# Patient Record
Sex: Male | Born: 1995
Health system: Southern US, Community
[De-identification: ages and names within clinical notes are randomized; demographics above are authoritative.]

## PROBLEM LIST (undated history)

## (undated) DIAGNOSIS — T7840XA Allergy, unspecified, initial encounter: Secondary | ICD-10-CM

## (undated) DIAGNOSIS — S31139A Puncture wound of abdominal wall without foreign body, unspecified quadrant without penetration into peritoneal cavity, initial encounter: Secondary | ICD-10-CM

## (undated) DIAGNOSIS — J45909 Unspecified asthma, uncomplicated: Secondary | ICD-10-CM

## (undated) DIAGNOSIS — W3400XA Accidental discharge from unspecified firearms or gun, initial encounter: Secondary | ICD-10-CM

## (undated) HISTORY — PX: ABDOMINAL SURGERY: SHX537

## (undated) HISTORY — PX: OTHER SURGICAL HISTORY: SHX169

## (undated) HISTORY — DX: Allergy, unspecified, initial encounter: T78.40XA

---
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
## 2014-09-13 ENCOUNTER — Inpatient Hospital Stay (HOSPITAL_COMMUNITY): Payer: 59

## 2014-09-13 ENCOUNTER — Inpatient Hospital Stay (HOSPITAL_COMMUNITY)
Admission: EM | Admit: 2014-09-13 | Discharge: 2014-09-19 | DRG: 957 | Disposition: A | Discharge status: Home / Self Care | Payer: 59 | Attending: General Surgery | Admitting: General Surgery

## 2014-09-13 ENCOUNTER — Encounter (HOSPITAL_COMMUNITY): Payer: Self-pay | Admitting: Certified Registered"

## 2014-09-13 ENCOUNTER — Emergency Department (HOSPITAL_COMMUNITY): Payer: 59

## 2014-09-13 ENCOUNTER — Emergency Department (HOSPITAL_COMMUNITY): Payer: 59 | Admitting: Certified Registered"

## 2014-09-13 ENCOUNTER — Encounter (HOSPITAL_COMMUNITY): Admission: EM | Disposition: A | Discharge status: Home / Self Care | Payer: Self-pay | Source: Home / Self Care

## 2014-09-13 DIAGNOSIS — R579 Shock, unspecified: Secondary | ICD-10-CM | POA: Diagnosis present

## 2014-09-13 DIAGNOSIS — N170 Acute kidney failure with tubular necrosis: Secondary | ICD-10-CM | POA: Diagnosis not present

## 2014-09-13 DIAGNOSIS — K661 Hemoperitoneum: Secondary | ICD-10-CM | POA: Diagnosis present

## 2014-09-13 DIAGNOSIS — S37031A Laceration of right kidney, unspecified degree, initial encounter: Principal | ICD-10-CM | POA: Diagnosis present

## 2014-09-13 DIAGNOSIS — S36119A Unspecified injury of liver, initial encounter: Secondary | ICD-10-CM | POA: Diagnosis present

## 2014-09-13 DIAGNOSIS — R578 Other shock: Secondary | ICD-10-CM | POA: Diagnosis present

## 2014-09-13 DIAGNOSIS — S36116A Major laceration of liver, initial encounter: Secondary | ICD-10-CM | POA: Diagnosis present

## 2014-09-13 DIAGNOSIS — S37001A Unspecified injury of right kidney, initial encounter: Secondary | ICD-10-CM | POA: Diagnosis present

## 2014-09-13 DIAGNOSIS — S31640A Puncture wound with foreign body of abdominal wall, right upper quadrant with penetration into peritoneal cavity, initial encounter: Secondary | ICD-10-CM | POA: Diagnosis present

## 2014-09-13 DIAGNOSIS — D62 Acute posthemorrhagic anemia: Secondary | ICD-10-CM | POA: Diagnosis present

## 2014-09-13 DIAGNOSIS — W3400XA Accidental discharge from unspecified firearms or gun, initial encounter: Secondary | ICD-10-CM | POA: Diagnosis not present

## 2014-09-13 DIAGNOSIS — Z978 Presence of other specified devices: Secondary | ICD-10-CM

## 2014-09-13 HISTORY — DX: Unspecified asthma, uncomplicated: J45.909

## 2014-09-13 HISTORY — PX: LAPAROTOMY: SHX154

## 2014-09-13 HISTORY — PX: NEPHRECTOMY: SHX65

## 2014-09-13 LAB — APTT: aPTT: 37 seconds (ref 24–37)

## 2014-09-13 LAB — POCT I-STAT 3, ART BLOOD GAS (G3+)
ACID-BASE DEFICIT: 1 mmol/L (ref 0.0–2.0)
BICARBONATE: 23.7 meq/L (ref 20.0–24.0)
O2 SAT: 100 %
PH ART: 7.382 (ref 7.350–7.450)
TCO2: 25 mmol/L (ref 0–100)
pCO2 arterial: 39.9 mmHg (ref 35.0–45.0)
pO2, Arterial: 244 mmHg — ABNORMAL HIGH (ref 80.0–100.0)

## 2014-09-13 LAB — BASIC METABOLIC PANEL
Anion gap: 7 (ref 5–15)
BUN: 10 mg/dL (ref 6–23)
CALCIUM: 8.2 mg/dL — AB (ref 8.4–10.5)
CO2: 22 mmol/L (ref 19–32)
Chloride: 105 mmol/L (ref 96–112)
Creatinine, Ser: 1.45 mg/dL — ABNORMAL HIGH (ref 0.50–1.35)
GFR calc Af Amer: 79 mL/min — ABNORMAL LOW (ref >90)
GFR, EST NON AFRICAN AMERICAN: 68 mL/min — AB (ref >90)
GLUCOSE: 183 mg/dL — AB (ref 70–99)
Potassium: 3.3 mmol/L — ABNORMAL LOW (ref 3.5–5.1)
Sodium: 134 mmol/L — ABNORMAL LOW (ref 135–145)

## 2014-09-13 LAB — COMPREHENSIVE METABOLIC PANEL
ALT: 61 U/L — ABNORMAL HIGH (ref 0–53)
AST: 97 U/L — ABNORMAL HIGH (ref 0–37)
Albumin: 4.3 g/dL (ref 3.5–5.2)
Alkaline Phosphatase: 80 U/L (ref 39–117)
Anion gap: 25 — ABNORMAL HIGH (ref 5–15)
BUN: 10 mg/dL (ref 6–23)
CO2: 10 mmol/L — CL (ref 19–32)
Calcium: 9.4 mg/dL (ref 8.4–10.5)
Chloride: 103 mmol/L (ref 96–112)
Creatinine, Ser: 1.55 mg/dL — ABNORMAL HIGH (ref 0.50–1.35)
GFR calc Af Amer: 73 mL/min — ABNORMAL LOW (ref >90)
GFR calc non Af Amer: 63 mL/min — ABNORMAL LOW (ref >90)
Glucose, Bld: 135 mg/dL — ABNORMAL HIGH (ref 70–99)
Potassium: 3.1 mmol/L — ABNORMAL LOW (ref 3.5–5.1)
Sodium: 138 mmol/L (ref 135–145)
Total Bilirubin: 1.3 mg/dL — ABNORMAL HIGH (ref 0.3–1.2)
Total Protein: 6.7 g/dL (ref 6.0–8.3)

## 2014-09-13 LAB — CBC
HCT: 45.2 % (ref 39.0–52.0)
HEMATOCRIT: 31.6 % — AB (ref 39.0–52.0)
HEMATOCRIT: 35.6 % — AB (ref 39.0–52.0)
HEMOGLOBIN: 14.9 g/dL (ref 13.0–17.0)
Hemoglobin: 10.7 g/dL — ABNORMAL LOW (ref 13.0–17.0)
Hemoglobin: 12.1 g/dL — ABNORMAL LOW (ref 13.0–17.0)
MCH: 29.8 pg (ref 26.0–34.0)
MCH: 30 pg (ref 26.0–34.0)
MCH: 31.4 pg (ref 26.0–34.0)
MCHC: 33 g/dL (ref 30.0–36.0)
MCHC: 33.9 g/dL (ref 30.0–36.0)
MCHC: 34 g/dL (ref 30.0–36.0)
MCV: 87.7 fL (ref 78.0–100.0)
MCV: 88.5 fL (ref 78.0–100.0)
MCV: 95.2 fL (ref 78.0–100.0)
PLATELETS: 154 10*3/uL (ref 150–400)
Platelets: 182 10*3/uL (ref 150–400)
Platelets: 226 10*3/uL (ref 150–400)
RBC: 3.57 MIL/uL — AB (ref 4.22–5.81)
RBC: 4.06 MIL/uL — ABNORMAL LOW (ref 4.22–5.81)
RBC: 4.75 MIL/uL (ref 4.22–5.81)
RDW: 13 % (ref 11.5–15.5)
RDW: 13 % (ref 11.5–15.5)
RDW: 13.3 % (ref 11.5–15.5)
WBC: 11.5 10*3/uL — ABNORMAL HIGH (ref 4.0–10.5)
WBC: 13.8 10*3/uL — ABNORMAL HIGH (ref 4.0–10.5)
WBC: 9.4 10*3/uL (ref 4.0–10.5)

## 2014-09-13 LAB — POCT I-STAT 7, (LYTES, BLD GAS, ICA,H+H)
Acid-base deficit: 3 mmol/L — ABNORMAL HIGH (ref 0.0–2.0)
Acid-base deficit: 3 mmol/L — ABNORMAL HIGH (ref 0.0–2.0)
Bicarbonate: 23.1 meq/L (ref 20.0–24.0)
Bicarbonate: 23.4 meq/L (ref 20.0–24.0)
CALCIUM ION: 0.99 mmol/L — AB (ref 1.12–1.23)
Calcium, Ion: 0.92 mmol/L — ABNORMAL LOW (ref 1.12–1.23)
HCT: 38 % — ABNORMAL LOW (ref 39.0–52.0)
HCT: 29 % — ABNORMAL LOW (ref 39.0–52.0)
HEMOGLOBIN: 12.9 g/dL — AB (ref 13.0–17.0)
Hemoglobin: 9.9 g/dL — ABNORMAL LOW (ref 13.0–17.0)
O2 Saturation: 100 %
O2 Saturation: 100 %
PCO2 ART: 46.7 mmHg — AB (ref 35.0–45.0)
PCO2 ART: 45.9 mmHg — AB (ref 35.0–45.0)
PO2 ART: 309 mmHg — AB (ref 80.0–100.0)
POTASSIUM: 3.6 mmol/L (ref 3.5–5.1)
POTASSIUM: 3.5 mmol/L (ref 3.5–5.1)
Sodium: 140 mmol/L (ref 135–145)
Sodium: 139 mmol/L (ref 135–145)
TCO2: 24 mmol/L (ref 0–100)
TCO2: 25 mmol/L (ref 0–100)
pH, Arterial: 7.303 — ABNORMAL LOW (ref 7.350–7.450)
pH, Arterial: 7.315 — ABNORMAL LOW (ref 7.350–7.450)
pO2, Arterial: 335 mmHg — ABNORMAL HIGH (ref 80.0–100.0)

## 2014-09-13 LAB — CDS SEROLOGY

## 2014-09-13 LAB — I-STAT CHEM 8, ED
BUN: 13 mg/dL (ref 6–23)
CREATININE: 1.4 mg/dL — AB (ref 0.50–1.35)
Calcium, Ion: 1.18 mmol/L (ref 1.12–1.23)
Chloride: 104 mmol/L (ref 96–112)
GLUCOSE: 131 mg/dL — AB (ref 70–99)
HCT: 49 % (ref 39.0–52.0)
Hemoglobin: 16.7 g/dL (ref 13.0–17.0)
Potassium: 3.4 mmol/L — ABNORMAL LOW (ref 3.5–5.1)
SODIUM: 141 mmol/L (ref 135–145)
TCO2: 10 mmol/L (ref 0–100)

## 2014-09-13 LAB — MRSA PCR SCREENING: MRSA by PCR: NEGATIVE

## 2014-09-13 LAB — PROTIME-INR
INR: 1.22 (ref 0.00–1.49)
INR: 1.43 (ref 0.00–1.49)
PROTHROMBIN TIME: 15.6 s — AB (ref 11.6–15.2)
Prothrombin Time: 17.6 seconds — ABNORMAL HIGH (ref 11.6–15.2)

## 2014-09-13 LAB — ETHANOL: Alcohol, Ethyl (B): <5 mg/dL (ref 0–9)

## 2014-09-13 LAB — BLOOD PRODUCT ORDER (VERBAL) VERIFICATION

## 2014-09-13 LAB — I-STAT CG4 LACTIC ACID, ED: Lactic Acid, Venous: >17 mmol/L (ref 0.5–2.0)

## 2014-09-13 LAB — LACTIC ACID, PLASMA: Lactic Acid, Venous: 1.5 mmol/L (ref 0.5–2.0)

## 2014-09-13 LAB — ABO/RH: ABO/RH(D): O POS

## 2014-09-13 LAB — TRIGLYCERIDES: Triglycerides: 127 mg/dL (ref <150)

## 2014-09-13 SURGERY — LAPAROTOMY, EXPLORATORY
Anesthesia: General | Site: Abdomen | Laterality: Right

## 2014-09-13 MED ORDER — LIDOCAINE HCL (CARDIAC) 20 MG/ML IV SOLN
INTRAVENOUS | Status: DC | PRN
  Administered 2014-09-13: 100 mg via INTRAVENOUS

## 2014-09-13 MED ORDER — PANTOPRAZOLE SODIUM 40 MG IV SOLR
40.0000 mg | Freq: Every day | INTRAVENOUS | Status: DC
  Administered 2014-09-13 – 2014-09-17 (×4): 40 mg via INTRAVENOUS
  Filled 2014-09-13 (×4): qty 40

## 2014-09-13 MED ORDER — SUCCINYLCHOLINE CHLORIDE 20 MG/ML IJ SOLN
INTRAMUSCULAR | Status: DC | PRN
  Administered 2014-09-13: 140 mg via INTRAVENOUS

## 2014-09-13 MED ORDER — CEFAZOLIN SODIUM-DEXTROSE 2-3 GM-% IV SOLR
2.0000 g | Freq: Once | INTRAVENOUS | Status: AC
  Administered 2014-09-13: 2 g via INTRAVENOUS

## 2014-09-13 MED ORDER — MIDAZOLAM HCL 2 MG/2ML IJ SOLN
INTRAMUSCULAR | Status: AC
  Filled 2014-09-13: qty 2

## 2014-09-13 MED ORDER — FENTANYL BOLUS VIA INFUSION
50.0000 ug | INTRAVENOUS | Status: DC | PRN
  Administered 2014-09-13 (×3): 50 ug via INTRAVENOUS
  Filled 2014-09-13: qty 50

## 2014-09-13 MED ORDER — IBUPROFEN 200 MG PO TABS: 200.0000 mg | ORAL_TABLET | Freq: Four times a day (QID) | ORAL | Status: DC | PRN

## 2014-09-13 MED ORDER — CHLORHEXIDINE GLUCONATE 0.12 % MT SOLN
15.0000 mL | Freq: Two times a day (BID) | OROMUCOSAL | Status: DC
  Administered 2014-09-13: 15 mL via OROMUCOSAL
  Filled 2014-09-13: qty 15

## 2014-09-13 MED ORDER — PANTOPRAZOLE SODIUM 40 MG PO TBEC: 40.0000 mg | DELAYED_RELEASE_TABLET | Freq: Every day | ORAL | Status: DC

## 2014-09-13 MED ORDER — PROPOFOL 10 MG/ML IV BOLUS
INTRAVENOUS | Status: DC | PRN
  Administered 2014-09-13: 150 mg via INTRAVENOUS

## 2014-09-13 MED ORDER — PROPOFOL 1000 MG/100ML IV EMUL
0.0000 ug/kg/min | INTRAVENOUS | Status: DC
  Administered 2014-09-13 (×2): 50 ug/kg/min via INTRAVENOUS
  Filled 2014-09-13: qty 100

## 2014-09-13 MED ORDER — FENTANYL CITRATE (PF) 100 MCG/2ML IJ SOLN
50.0000 ug | Freq: Once | INTRAMUSCULAR | Status: AC
  Administered 2014-09-13: 50 ug via INTRAVENOUS
  Filled 2014-09-13: qty 2

## 2014-09-13 MED ORDER — HEMOSTATIC AGENTS (NO CHARGE) OPTIME
TOPICAL | Status: DC | PRN
  Administered 2014-09-13: 2 via TOPICAL

## 2014-09-13 MED ORDER — FENTANYL CITRATE (PF) 100 MCG/2ML IJ SOLN
INTRAMUSCULAR | Status: DC | PRN
  Administered 2014-09-13: 50 ug via INTRAVENOUS
  Administered 2014-09-13 (×2): 100 ug via INTRAVENOUS

## 2014-09-13 MED ORDER — KETOROLAC TROMETHAMINE 30 MG/ML IJ SOLN: 30.0000 mg | Freq: Once | INTRAMUSCULAR | Status: DC | PRN

## 2014-09-13 MED ORDER — OXYCODONE HCL 5 MG PO TABS: 5.0000 mg | ORAL_TABLET | Freq: Once | ORAL | Status: DC | PRN

## 2014-09-13 MED ORDER — MEPERIDINE HCL 25 MG/ML IJ SOLN: 6.2500 mg | INTRAMUSCULAR | Status: DC | PRN

## 2014-09-13 MED ORDER — IBUPROFEN 100 MG/5ML PO SUSP
200.0000 mg | Freq: Four times a day (QID) | ORAL | Status: DC | PRN
  Filled 2014-09-13: qty 20

## 2014-09-13 MED ORDER — OXYCODONE HCL 5 MG/5ML PO SOLN: 5.0000 mg | Freq: Once | ORAL | Status: DC | PRN

## 2014-09-13 MED ORDER — SODIUM CHLORIDE 0.9 % IV SOLN
Freq: Once | INTRAVENOUS | Status: AC
  Administered 2014-09-13: 999 mL via INTRAVENOUS

## 2014-09-13 MED ORDER — MIDAZOLAM HCL 5 MG/5ML IJ SOLN
INTRAMUSCULAR | Status: DC | PRN
  Administered 2014-09-13 (×2): 2 mg via INTRAVENOUS

## 2014-09-13 MED ORDER — FENTANYL CITRATE (PF) 100 MCG/2ML IJ SOLN
50.0000 ug | Freq: Once | INTRAMUSCULAR | Status: AC
  Administered 2014-09-13: 50 ug via INTRAVENOUS

## 2014-09-13 MED ORDER — ROCURONIUM BROMIDE 100 MG/10ML IV SOLN
INTRAVENOUS | Status: DC | PRN
  Administered 2014-09-13 (×2): 50 mg via INTRAVENOUS

## 2014-09-13 MED ORDER — FENTANYL CITRATE (PF) 2500 MCG/50ML IJ SOLN
25.0000 ug/h | INTRAMUSCULAR | Status: DC
  Administered 2014-09-13: 100 ug/h via INTRAVENOUS
  Filled 2014-09-13: qty 50

## 2014-09-13 MED ORDER — CEFAZOLIN SODIUM-DEXTROSE 2-3 GM-% IV SOLR
INTRAVENOUS | Status: AC
  Filled 2014-09-13: qty 50

## 2014-09-13 MED ORDER — FENTANYL CITRATE (PF) 250 MCG/5ML IJ SOLN
INTRAMUSCULAR | Status: AC
  Filled 2014-09-13: qty 5

## 2014-09-13 MED ORDER — HYDROMORPHONE HCL 1 MG/ML IJ SOLN: 0.2500 mg | INTRAMUSCULAR | Status: DC | PRN

## 2014-09-13 MED ORDER — KCL IN DEXTROSE-NACL 20-5-0.45 MEQ/L-%-% IV SOLN
INTRAVENOUS | Status: DC
  Administered 2014-09-13 – 2014-09-14 (×4): via INTRAVENOUS
  Filled 2014-09-13 (×7): qty 1000

## 2014-09-13 MED ORDER — PROPOFOL INFUSION 10 MG/ML OPTIME
INTRAVENOUS | Status: DC | PRN
  Administered 2014-09-13: 100 ug/kg/min via INTRAVENOUS

## 2014-09-13 MED ORDER — FENTANYL CITRATE (PF) 100 MCG/2ML IJ SOLN
INTRAMUSCULAR | Status: AC
  Administered 2014-09-13: 50 ug via INTRAVENOUS
  Filled 2014-09-13: qty 2

## 2014-09-13 MED ORDER — ONDANSETRON HCL 4 MG/2ML IJ SOLN
4.0000 mg | Freq: Four times a day (QID) | INTRAMUSCULAR | Status: DC | PRN
  Administered 2014-09-13: 4 mg via INTRAVENOUS
  Filled 2014-09-13: qty 2

## 2014-09-13 MED ORDER — HYDROMORPHONE HCL 1 MG/ML IJ SOLN
0.5000 mg | INTRAMUSCULAR | Status: DC | PRN
  Administered 2014-09-13 (×5): 1 mg via INTRAVENOUS
  Filled 2014-09-13 (×5): qty 1

## 2014-09-13 MED ORDER — CETYLPYRIDINIUM CHLORIDE 0.05 % MT LIQD
7.0000 mL | Freq: Four times a day (QID) | OROMUCOSAL | Status: DC
  Administered 2014-09-13: 7 mL via OROMUCOSAL

## 2014-09-13 MED ORDER — 0.9 % SODIUM CHLORIDE (POUR BTL) OPTIME
TOPICAL | Status: DC | PRN
  Administered 2014-09-13: 4000 mL

## 2014-09-13 MED ORDER — HYDROMORPHONE HCL 1 MG/ML IJ SOLN
1.0000 mg | INTRAMUSCULAR | Status: DC | PRN
  Administered 2014-09-14 – 2014-09-15 (×11): 2 mg via INTRAVENOUS
  Filled 2014-09-13 (×11): qty 2

## 2014-09-13 MED ORDER — LACTATED RINGERS IV SOLN
INTRAVENOUS | Status: DC | PRN
  Administered 2014-09-13 (×3): via INTRAVENOUS

## 2014-09-13 MED ORDER — PROPOFOL 10 MG/ML IV BOLUS
INTRAVENOUS | Status: AC
  Filled 2014-09-13: qty 20

## 2014-09-13 SURGICAL SUPPLY — 57 items
APPLIER CLIP ROT 10 11.4 M/L (STAPLE) ×4
BLADE SURG ROTATE 9660 (MISCELLANEOUS) IMPLANT
CANISTER SUCTION 2500CC (MISCELLANEOUS) ×4 IMPLANT
CHLORAPREP W/TINT 26ML (MISCELLANEOUS) IMPLANT
CLIP APPLIE ROT 10 11.4 M/L (STAPLE) ×2 IMPLANT
CLIP TI LARGE 6 (CLIP) ×4 IMPLANT
COVER MAYO STAND STRL (DRAPES) IMPLANT
COVER SURGICAL LIGHT HANDLE (MISCELLANEOUS) ×4 IMPLANT
CUTTER LINEAR ENDO 35 ART THIN (STAPLE) ×4 IMPLANT
DRAPE LAPAROSCOPIC ABDOMINAL (DRAPES) ×4 IMPLANT
DRAPE PROXIMA HALF (DRAPES) IMPLANT
DRAPE UTILITY XL STRL (DRAPES) ×8 IMPLANT
DRAPE WARM FLUID 44X44 (DRAPE) ×4 IMPLANT
DRSG OPSITE POSTOP 4X10 (GAUZE/BANDAGES/DRESSINGS) IMPLANT
DRSG OPSITE POSTOP 4X8 (GAUZE/BANDAGES/DRESSINGS) IMPLANT
ELECT BLADE 6.5 EXT (BLADE) ×4 IMPLANT
ELECT CAUTERY BLADE 6.4 (BLADE) ×8 IMPLANT
ELECT REM PT RETURN 9FT ADLT (ELECTROSURGICAL) ×4
ELECTRODE REM PT RTRN 9FT ADLT (ELECTROSURGICAL) ×2 IMPLANT
GLOVE BIO SURGEON STRL SZ7.5 (GLOVE) ×12 IMPLANT
GLOVE BIO SURGEON STRL SZ8 (GLOVE) ×4 IMPLANT
GLOVE BIOGEL PI IND STRL 7.0 (GLOVE) ×2 IMPLANT
GLOVE BIOGEL PI IND STRL 7.5 (GLOVE) ×2 IMPLANT
GLOVE BIOGEL PI IND STRL 8 (GLOVE) ×4 IMPLANT
GLOVE BIOGEL PI INDICATOR 7.0 (GLOVE) ×2
GLOVE BIOGEL PI INDICATOR 7.5 (GLOVE) ×2
GLOVE BIOGEL PI INDICATOR 8 (GLOVE) ×4
GLOVE ECLIPSE 7.0 STRL STRAW (GLOVE) ×8 IMPLANT
GOWN STRL REUS W/ TWL LRG LVL3 (GOWN DISPOSABLE) ×4 IMPLANT
GOWN STRL REUS W/ TWL XL LVL3 (GOWN DISPOSABLE) ×2 IMPLANT
GOWN STRL REUS W/TWL LRG LVL3 (GOWN DISPOSABLE) ×4
GOWN STRL REUS W/TWL XL LVL3 (GOWN DISPOSABLE) ×2
KIT BASIN OR (CUSTOM PROCEDURE TRAY) ×4 IMPLANT
KIT ROOM TURNOVER OR (KITS) ×4 IMPLANT
LIGASURE IMPACT 36 18CM CVD LR (INSTRUMENTS) IMPLANT
NS IRRIG 1000ML POUR BTL (IV SOLUTION) ×8 IMPLANT
PACK GENERAL/GYN (CUSTOM PROCEDURE TRAY) ×4 IMPLANT
PAD ARMBOARD 7.5X6 YLW CONV (MISCELLANEOUS) ×4 IMPLANT
PENCIL BUTTON HOLSTER BLD 10FT (ELECTRODE) IMPLANT
RELOAD /EVU35 (ENDOMECHANICALS) ×4 IMPLANT
SOLUTION BETADINE 4OZ (MISCELLANEOUS) ×4 IMPLANT
SPECIMEN JAR LARGE (MISCELLANEOUS) IMPLANT
SPONGE GAUZE 4X4 12PLY STER LF (GAUZE/BANDAGES/DRESSINGS) ×4 IMPLANT
SPONGE LAP 18X18 X RAY DECT (DISPOSABLE) ×24 IMPLANT
STAPLER VISISTAT 35W (STAPLE) ×4 IMPLANT
SUCTION POOLE TIP (SUCTIONS) ×4 IMPLANT
SUT PDS AB 1 TP1 96 (SUTURE) ×8 IMPLANT
SUT SILK 2 0 SH CR/8 (SUTURE) ×4 IMPLANT
SUT SILK 2 0 TIES 10X30 (SUTURE) ×4 IMPLANT
SUT SILK 3 0 SH CR/8 (SUTURE) ×4 IMPLANT
SUT SILK 3 0 TIES 10X30 (SUTURE) ×4 IMPLANT
TAPE CLOTH SURG 4X10 WHT LF (GAUZE/BANDAGES/DRESSINGS) ×4 IMPLANT
TOWEL OR 17X26 10 PK STRL BLUE (TOWEL DISPOSABLE) ×4 IMPLANT
TRAY FOLEY CATH 16FRSI W/METER (SET/KITS/TRAYS/PACK) ×4 IMPLANT
TUBE CONNECTING 12'X1/4 (SUCTIONS)
TUBE CONNECTING 12X1/4 (SUCTIONS) IMPLANT
YANKAUER SUCT BULB TIP NO VENT (SUCTIONS) IMPLANT

## 2014-09-13 NOTE — ED Notes (Signed)
Patient taken to the OR

## 2014-09-13 NOTE — Anesthesia Preprocedure Evaluation (Addendum)
Anesthesia Evaluation  Patient identified by MRN, date of birth, ID band Patient awake    Reviewed: Allergy & Precautions, NPO status , Patient's Chart, lab work & pertinent test resultsPreop documentation limited or incomplete due to emergent nature of procedure.  Airway Mallampati: I  TM Distance: >3 FB Neck ROM: Full    Dental  (+) Teeth Intact, Dental Advisory Given   Pulmonary  breath sounds clear to auscultation        Cardiovascular Rhythm:Regular Rate:Tachycardia     Neuro/Psych    GI/Hepatic   Endo/Other    Renal/GU      Musculoskeletal   Abdominal   Peds  Hematology   Anesthesia Other Findings   Reproductive/Obstetrics                            Anesthesia Physical Anesthesia Plan  ASA: IV and emergent  Anesthesia Plan: General   Post-op Pain Management:    Induction: Intravenous, Rapid sequence and Cricoid pressure planned  Airway Management Planned: Oral ETT  Additional Equipment:   Intra-op Plan:   Post-operative Plan: Possible Post-op intubation/ventilation  Informed Consent: I have reviewed the patients History and Physical, chart, labs and discussed the procedure including the risks, benefits and alternatives for the proposed anesthesia with the patient or authorized representative who has indicated his/her understanding and acceptance.   Dental advisory given  Plan Discussed with: Anesthesiologist, Surgeon and CRNA  Anesthesia Plan Comments:        Anesthesia Quick Evaluation
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
## 2014-09-13 NOTE — Progress Notes (Signed)
UR completed.  Shayla Heming, RN BSN M[facility] CCM Trauma/Neuro ICU Case Manager [phone]  
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
## 2014-09-13 NOTE — Progress Notes (Signed)
Fentanyl gtt 150 cc wasted in sink. Witnessed by K. Hyatt, Charity fundraiserN.

## 2014-09-13 NOTE — Transfer of Care (Signed)
Immediate Anesthesia Transfer of Care Note  Patient: Zachary Villegas  Procedure(s) Performed: Procedure(s): EXPLORATORY LAPAROTOMY,HEPATORRPHY REPAIR<AND RIGHT KIDNEY REMOVAL. (N/A) NEPHRECTOMY (Right)  Patient Location: ICU  Anesthesia Type:General  Level of Consciousness: sedated, unresponsive and Patient remains intubated per anesthesia plan  Airway & Oxygen Therapy: Patient remains intubated per anesthesia plan and Patient placed on Ventilator (see vital sign flow sheet for setting)  Post-op Assessment: Report given to RN and Post -op Vital signs reviewed and stable  Post vital signs: Reviewed and stable  Last Vitals:  Filed Vitals:   09/13/14 0015  BP: 114/59  Pulse: 107  Resp: 18    Complications: No apparent anesthesia complications
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
## 2014-09-13 NOTE — Anesthesia Postprocedure Evaluation (Signed)
  Anesthesia Post-op Note  Patient: Careers adviseravour XXXChukwu  Procedure(s) Performed: Procedure(s): EXPLORATORY LAPAROTOMY,HEPATORRPHY REPAIR<AND RIGHT KIDNEY REMOVAL. (N/A) NEPHRECTOMY (Right)  Patient Location: ICU  Anesthesia Type:General  Level of Consciousness: sedated  Airway and Oxygen Therapy: Patient Spontanous Breathing  Post-op Pain: mild  Post-op Assessment: Post-op Vital signs reviewed  Post-op Vital Signs: Reviewed  Last Vitals:  Filed Vitals:   09/13/14 0700  BP: 124/72  Pulse: 85  Temp:   Resp: 16    Complications: No apparent anesthesia complications
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
## 2014-09-13 NOTE — Procedures (Signed)
Extubation Procedure Note  Patient Details:   Name: Jory SimsFavour XXXChukwu DOB: 02/23/1996 MRN: [id number]   Airway Documentation:     Evaluation  O2 sats: stable throughout Complications: No apparent complications Patient did tolerate procedure well. Bilateral Breath Sounds: Clear Suctioning: Oral Yes   Pt extubated to 2L [place]. No stridor or dyspnea noted after extubation. RT will continue to monitor.   Armando GangMike, Ridley Dileo C 09/13/2014, 9:31 AM

## 2014-09-13 NOTE — Progress Notes (Signed)
Pt vomited small amount. MD notified. Zofran ordered. Will cont to monitor.
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
## 2014-09-13 NOTE — Anesthesia Procedure Notes (Signed)
Procedure Name: Intubation Date/Time: 09/13/2014 12:36 AM Performed by: Arlice ColtMANESS, Torryn Fiske B Pre-anesthesia Checklist: Patient identified, Emergency Drugs available, Suction available, Patient being monitored and Timeout performed Patient Re-evaluated:Patient Re-evaluated prior to inductionOxygen Delivery Method: Circle system utilized Preoxygenation: Pre-oxygenation with 100% oxygen Intubation Type: IV induction, Rapid sequence and Cricoid Pressure applied Laryngoscope Size: Mac and 3 Grade View: Grade I Tube type: Subglottic suction tube Tube size: 7.5 mm Number of attempts: 1 Airway Equipment and Method: Stylet Placement Confirmation: ETT inserted through vocal cords under direct vision,  positive ETCO2 and breath sounds checked- equal and bilateral Secured at: 22 cm Tube secured with: Tape Dental Injury: Teeth and Oropharynx as per pre-operative assessment

## 2014-09-14 ENCOUNTER — Encounter (HOSPITAL_COMMUNITY): Payer: Self-pay | Admitting: General Surgery

## 2014-09-14 LAB — PREPARE FRESH FROZEN PLASMA
Unit division: 0
Unit division: 0
Unit division: 0
Unit division: 0

## 2014-09-14 LAB — BASIC METABOLIC PANEL
ANION GAP: 7 (ref 5–15)
BUN: 8 mg/dL (ref 6–23)
CALCIUM: 8.4 mg/dL (ref 8.4–10.5)
CO2: 25 mmol/L (ref 19–32)
Chloride: 101 mmol/L (ref 96–112)
Creatinine, Ser: 1.79 mg/dL — ABNORMAL HIGH (ref 0.50–1.35)
GFR calc Af Amer: 61 mL/min — ABNORMAL LOW (ref >90)
GFR, EST NON AFRICAN AMERICAN: 53 mL/min — AB (ref >90)
Glucose, Bld: 123 mg/dL — ABNORMAL HIGH (ref 70–99)
Potassium: 4.3 mmol/L (ref 3.5–5.1)
SODIUM: 133 mmol/L — AB (ref 135–145)

## 2014-09-14 LAB — CBC
HCT: 25.8 % — ABNORMAL LOW (ref 39.0–52.0)
HEMATOCRIT: 28.2 % — AB (ref 39.0–52.0)
HEMATOCRIT: 26.2 % — AB (ref 39.0–52.0)
Hemoglobin: 9.6 g/dL — ABNORMAL LOW (ref 13.0–17.0)
Hemoglobin: 8.9 g/dL — ABNORMAL LOW (ref 13.0–17.0)
Hemoglobin: 8.8 g/dL — ABNORMAL LOW (ref 13.0–17.0)
MCH: 30.1 pg (ref 26.0–34.0)
MCH: 30.3 pg (ref 26.0–34.0)
MCH: 30.2 pg (ref 26.0–34.0)
MCHC: 34 g/dL (ref 30.0–36.0)
MCHC: 34 g/dL (ref 30.0–36.0)
MCHC: 34.1 g/dL (ref 30.0–36.0)
MCV: 88.4 fL (ref 78.0–100.0)
MCV: 89.1 fL (ref 78.0–100.0)
MCV: 88.7 fL (ref 78.0–100.0)
PLATELETS: 146 10*3/uL — AB (ref 150–400)
PLATELETS: 150 10*3/uL (ref 150–400)
Platelets: 147 10*3/uL — ABNORMAL LOW (ref 150–400)
RBC: 3.19 MIL/uL — ABNORMAL LOW (ref 4.22–5.81)
RBC: 2.94 MIL/uL — ABNORMAL LOW (ref 4.22–5.81)
RBC: 2.91 MIL/uL — ABNORMAL LOW (ref 4.22–5.81)
RDW: 13.5 % (ref 11.5–15.5)
RDW: 13.4 % (ref 11.5–15.5)
RDW: 13.3 % (ref 11.5–15.5)
WBC: 9.4 10*3/uL (ref 4.0–10.5)
WBC: 9.2 10*3/uL (ref 4.0–10.5)
WBC: 10.1 10*3/uL (ref 4.0–10.5)

## 2014-09-14 MED ORDER — ACETAMINOPHEN 325 MG PO TABS
650.0000 mg | ORAL_TABLET | Freq: Four times a day (QID) | ORAL | Status: DC | PRN
  Administered 2014-09-14 – 2014-09-17 (×8): 650 mg via ORAL
  Filled 2014-09-14 (×8): qty 2

## 2014-09-14 MED ORDER — POTASSIUM CHLORIDE 2 MEQ/ML IV SOLN
INTRAVENOUS | Status: DC
  Administered 2014-09-14 – 2014-09-19 (×11): via INTRAVENOUS
  Filled 2014-09-14 (×24): qty 1000

## 2014-09-14 MED ORDER — OXYCODONE HCL 5 MG PO TABS: 5.0000 mg | ORAL_TABLET | ORAL | Status: DC | PRN
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
## 2014-09-15 DIAGNOSIS — S37001A Unspecified injury of right kidney, initial encounter: Secondary | ICD-10-CM | POA: Diagnosis present

## 2014-09-15 DIAGNOSIS — S36119A Unspecified injury of liver, initial encounter: Secondary | ICD-10-CM | POA: Diagnosis present

## 2014-09-15 DIAGNOSIS — D62 Acute posthemorrhagic anemia: Secondary | ICD-10-CM | POA: Diagnosis not present

## 2014-09-15 LAB — CBC
HCT: 24 % — ABNORMAL LOW (ref 39.0–52.0)
Hemoglobin: 8.2 g/dL — ABNORMAL LOW (ref 13.0–17.0)
MCH: 30.4 pg (ref 26.0–34.0)
MCHC: 34.2 g/dL (ref 30.0–36.0)
MCV: 88.9 fL (ref 78.0–100.0)
PLATELETS: 158 10*3/uL (ref 150–400)
RBC: 2.7 MIL/uL — ABNORMAL LOW (ref 4.22–5.81)
RDW: 13.3 % (ref 11.5–15.5)
WBC: 9 10*3/uL (ref 4.0–10.5)

## 2014-09-15 LAB — BASIC METABOLIC PANEL
ANION GAP: 6 (ref 5–15)
BUN: 8 mg/dL (ref 6–23)
CALCIUM: 8.4 mg/dL (ref 8.4–10.5)
CHLORIDE: 98 mmol/L (ref 96–112)
CO2: 28 mmol/L (ref 19–32)
Creatinine, Ser: 1.75 mg/dL — ABNORMAL HIGH (ref 0.50–1.35)
GFR calc Af Amer: 63 mL/min — ABNORMAL LOW (ref >90)
GFR calc non Af Amer: 54 mL/min — ABNORMAL LOW (ref >90)
Glucose, Bld: 103 mg/dL — ABNORMAL HIGH (ref 70–99)
POTASSIUM: 4 mmol/L (ref 3.5–5.1)
Sodium: 132 mmol/L — ABNORMAL LOW (ref 135–145)

## 2014-09-15 MED ORDER — HYDROMORPHONE HCL 1 MG/ML IJ SOLN
0.5000 mg | INTRAMUSCULAR | Status: DC | PRN
  Administered 2014-09-15 – 2014-09-17 (×7): 0.5 mg via INTRAVENOUS
  Filled 2014-09-15 (×7): qty 1

## 2014-09-15 MED ORDER — OXYCODONE HCL 5 MG PO TABS
5.0000 mg | ORAL_TABLET | ORAL | Status: DC | PRN
  Administered 2014-09-15 – 2014-09-18 (×10): 15 mg via ORAL
  Filled 2014-09-15 (×11): qty 3

## 2014-09-15 MED ORDER — ENOXAPARIN SODIUM 40 MG/0.4ML ~~LOC~~ SOLN
40.0000 mg | SUBCUTANEOUS | Status: DC
  Administered 2014-09-15 – 2014-09-19 (×4): 40 mg via SUBCUTANEOUS
  Filled 2014-09-15 (×5): qty 0.4
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
## 2014-09-15 NOTE — Progress Notes (Signed)
Pt's  temp 102.8, prn tylenol 650mg  given, MD on call notified, Dr. Luisa Hartornett, with no new orders

## 2014-09-15 NOTE — Progress Notes (Signed)
PT Cancellation Note  Patient Details Name: Zachary Villegas MRN: [id number] DOB: 07/19/1995   Cancelled Treatment:    Reason Eval/Treat Not Completed: Patient declined, no reason specified (Pt stated "i'm fine". Declined PT at this time)  Would not remove covers from his face to talk with me when I was in room. Reports "i'm fine" but would not work with me. I asked if he wanted us to return tomorrow and he replied yes. Will check on pt again tomorrow. RN reports pt was able to get up and shower earlier today.    Donnella ShamSawulski, Declynn Lopresti J 09/15/2014, 2:55 PM Lavona MoundMark Mailin Coglianese, PT  [phone] 09/15/2014

## 2014-09-15 NOTE — Progress Notes (Signed)
Pt refused Vital sign, verbalized "i dont wat to be bothered".

## 2014-09-16 LAB — CBC
HCT: 25.5 % — ABNORMAL LOW (ref 39.0–52.0)
Hemoglobin: 8.6 g/dL — ABNORMAL LOW (ref 13.0–17.0)
MCH: 30 pg (ref 26.0–34.0)
MCHC: 33.7 g/dL (ref 30.0–36.0)
MCV: 88.9 fL (ref 78.0–100.0)
PLATELETS: 164 10*3/uL (ref 150–400)
RBC: 2.87 MIL/uL — ABNORMAL LOW (ref 4.22–5.81)
RDW: 13.3 % (ref 11.5–15.5)
WBC: 8.5 10*3/uL (ref 4.0–10.5)

## 2014-09-16 LAB — BASIC METABOLIC PANEL
Anion gap: 8 (ref 5–15)
BUN: 9 mg/dL (ref 6–23)
CO2: 27 mmol/L (ref 19–32)
CREATININE: 1.57 mg/dL — AB (ref 0.50–1.35)
Calcium: 8.8 mg/dL (ref 8.4–10.5)
Chloride: 99 mmol/L (ref 96–112)
GFR calc Af Amer: 72 mL/min — ABNORMAL LOW (ref >90)
GFR, EST NON AFRICAN AMERICAN: 62 mL/min — AB (ref >90)
Glucose, Bld: 91 mg/dL (ref 70–99)
Potassium: 3.8 mmol/L (ref 3.5–5.1)
Sodium: 134 mmol/L — ABNORMAL LOW (ref 135–145)

## 2014-09-16 NOTE — Progress Notes (Signed)
PT Cancellation Note  Patient Details Name: Zachary Villegas MRN: [id number] DOB: 11/16/1995   Cancelled Treatment:    Reason Eval/Treat Not Completed: Patient declined, no reason specified Continues to refuse physical therapy services despite requesting PT to return today for evaluation. Patient states "I'm not up to it." He does report he has been up with the assistance of nursing staff but has someone hold on to him. Will attempt evaluation tomorrow, if patient agreeable.  [place]erton Mount[place]bour, [name] S 09/16/2014, 1:51 PM Sunday SpillersLogan Secor [place], [place] [phone]
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
## 2014-09-16 NOTE — Assessment & Plan Note (Signed)
Right nephrectomy 

## 2014-09-16 NOTE — Assessment & Plan Note (Signed)
Right nephrectomy

## 2014-09-17 LAB — TYPE AND SCREEN
ABO/RH(D): O POS
Antibody Screen: NEGATIVE
UNIT DIVISION: 0
UNIT DIVISION: 0
UNIT DIVISION: 0
UNIT DIVISION: 0
Unit division: 0
Unit division: 0
Unit division: 0
Unit division: 0
Unit division: 0
Unit division: 0
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
## 2014-09-18 ENCOUNTER — Encounter (HOSPITAL_COMMUNITY): Payer: Self-pay

## 2014-09-18 LAB — BASIC METABOLIC PANEL
Anion gap: 9 (ref 5–15)
BUN: 10 mg/dL (ref 6–20)
CO2: 23 mmol/L (ref 22–32)
CREATININE: 1.34 mg/dL — AB (ref 0.61–1.24)
Calcium: 8.9 mg/dL (ref 8.9–10.3)
Chloride: 102 mmol/L (ref 101–111)
GFR calc Af Amer: >60 mL/min (ref >60)
GFR calc non Af Amer: >60 mL/min (ref >60)
Glucose, Bld: 115 mg/dL — ABNORMAL HIGH (ref 70–99)
POTASSIUM: 4.3 mmol/L (ref 3.5–5.1)
Sodium: 134 mmol/L — ABNORMAL LOW (ref 135–145)

## 2014-09-18 LAB — CBC
HCT: 24.2 % — ABNORMAL LOW (ref 39.0–52.0)
Hemoglobin: 8.1 g/dL — ABNORMAL LOW (ref 13.0–17.0)
MCH: 29.9 pg (ref 26.0–34.0)
MCHC: 33.5 g/dL (ref 30.0–36.0)
MCV: 89.3 fL (ref 78.0–100.0)
PLATELETS: 318 10*3/uL (ref 150–400)
RBC: 2.71 MIL/uL — ABNORMAL LOW (ref 4.22–5.81)
RDW: 13.4 % (ref 11.5–15.5)
WBC: 8.1 10*3/uL (ref 4.0–10.5)

## 2014-09-18 MED ORDER — OXYCODONE HCL 5 MG PO TABS
10.0000 mg | ORAL_TABLET | ORAL | Status: DC | PRN
  Administered 2014-09-18 – 2014-09-19 (×3): 20 mg via ORAL
  Filled 2014-09-18 (×3): qty 4

## 2014-09-18 MED ORDER — DOCUSATE SODIUM 100 MG PO CAPS
100.0000 mg | ORAL_CAPSULE | Freq: Two times a day (BID) | ORAL | Status: DC
  Administered 2014-09-18 – 2014-09-19 (×3): 100 mg via ORAL
  Filled 2014-09-18 (×3): qty 1

## 2014-09-18 MED ORDER — POLYETHYLENE GLYCOL 3350 17 G PO PACK
17.0000 g | PACK | Freq: Every day | ORAL | Status: DC
  Administered 2014-09-18 – 2014-09-19 (×2): 17 g via ORAL
  Filled 2014-09-18 (×3): qty 1

## 2014-09-18 MED ORDER — TRAMADOL HCL 50 MG PO TABS
100.0000 mg | ORAL_TABLET | Freq: Four times a day (QID) | ORAL | Status: DC
  Administered 2014-09-18 – 2014-09-19 (×6): 100 mg via ORAL
  Filled 2014-09-18 (×6): qty 2
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
## 2014-09-18 NOTE — Clinical Social Work Note (Signed)
Patient plans to return home at discharge with outpatient therapies pending progress.  Patient requested that family not be provided with any additional information.  Patient states that he has adequate housing and transportation at discharge.  Clinical Social Worker will sign off for now as social work intervention is no longer needed. Please consult us again if new need arises.  Macario GoldsJesse Renatha Rosen, [place]CS[place] [phone]

## 2014-09-19 ENCOUNTER — Encounter: Payer: Self-pay | Admitting: Orthopedic Surgery

## 2014-09-19 MED ORDER — OXYCODONE-ACETAMINOPHEN 10-325 MG PO TABS: 1.0000 | ORAL_TABLET | ORAL | Status: DC | PRN

## 2014-09-19 MED ORDER — TRAMADOL HCL 50 MG PO TABS: 100.0000 mg | ORAL_TABLET | Freq: Four times a day (QID) | ORAL | Status: DC

## 2014-09-19 NOTE — Progress Notes (Signed)
Discussed discharge summary with patient. Reviewed all medications with patient. Patient received Rx and all documents for rehab referrals. Patient ready for discharge.

## 2014-09-19 NOTE — Care Management Note (Signed)
Case Management Note  Patient Details  Name: Zachary Villegas MRN: [id number] Date of Birth: 06/04/1995   Expected Discharge Plan:  Home/Self Care  Post Acute Care Choice:  Durable Medical Equipment      DME Arranged:  3-N-1, Dan HumphreysWalker rolling DME Agency:  [facility]  Status of Service:  Completed, signed off  Medicare Important Message Given:  N/A - LOS <3 / Initial given by admissions   Azucena KubaBryson, Glenice Ciccone Diane, RN 09/19/2014, 4:19 PM
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
## 2014-09-19 NOTE — Discharge Instructions (Signed)
No lifting more than 5 pounds for 6 weeks. ° °No driving while taking oxycodone. ° °Wash wounds daily in shower with soap and water. °Do not soak. °Apply antibiotic ointment (e.g. Neosporin) twice daily and as needed to keep moist. °Cover with dry dressing. ° °

## 2014-09-22 ENCOUNTER — Encounter (HOSPITAL_COMMUNITY): Payer: Self-pay | Admitting: Emergency Medicine

## 2014-09-22 ENCOUNTER — Emergency Department (HOSPITAL_COMMUNITY): Payer: 59

## 2014-09-22 ENCOUNTER — Emergency Department (HOSPITAL_COMMUNITY)
Admission: EM | Admit: 2014-09-22 | Discharge: 2014-09-22 | Disposition: A | Discharge status: Home / Self Care | Payer: 59 | Attending: Emergency Medicine | Admitting: Emergency Medicine

## 2014-09-22 DIAGNOSIS — Z720 Tobacco use: Secondary | ICD-10-CM | POA: Diagnosis not present

## 2014-09-22 DIAGNOSIS — R569 Unspecified convulsions: Secondary | ICD-10-CM | POA: Diagnosis present

## 2014-09-22 DIAGNOSIS — Y9389 Activity, other specified: Secondary | ICD-10-CM | POA: Insufficient documentation

## 2014-09-22 DIAGNOSIS — Z79899 Other long term (current) drug therapy: Secondary | ICD-10-CM | POA: Insufficient documentation

## 2014-09-22 DIAGNOSIS — Z8781 Personal history of (healed) traumatic fracture: Secondary | ICD-10-CM | POA: Diagnosis not present

## 2014-09-22 DIAGNOSIS — Y998 Other external cause status: Secondary | ICD-10-CM | POA: Diagnosis not present

## 2014-09-22 DIAGNOSIS — S0990XA Unspecified injury of head, initial encounter: Secondary | ICD-10-CM | POA: Insufficient documentation

## 2014-09-22 DIAGNOSIS — J45909 Unspecified asthma, uncomplicated: Secondary | ICD-10-CM | POA: Diagnosis not present

## 2014-09-22 DIAGNOSIS — S199XXA Unspecified injury of neck, initial encounter: Secondary | ICD-10-CM | POA: Insufficient documentation

## 2014-09-22 DIAGNOSIS — Y92002 Bathroom of unspecified non-institutional (private) residence single-family (private) house as the place of occurrence of the external cause: Secondary | ICD-10-CM | POA: Insufficient documentation

## 2014-09-22 DIAGNOSIS — W01198A Fall on same level from slipping, tripping and stumbling with subsequent striking against other object, initial encounter: Secondary | ICD-10-CM | POA: Diagnosis not present

## 2014-09-22 LAB — CBC
HCT: 27.4 % — ABNORMAL LOW (ref 39.0–52.0)
Hemoglobin: 9 g/dL — ABNORMAL LOW (ref 13.0–17.0)
MCH: 29.8 pg (ref 26.0–34.0)
MCHC: 32.8 g/dL (ref 30.0–36.0)
MCV: 90.7 fL (ref 78.0–100.0)
PLATELETS: 739 10*3/uL — AB (ref 150–400)
RBC: 3.02 MIL/uL — ABNORMAL LOW (ref 4.22–5.81)
RDW: 13.6 % (ref 11.5–15.5)
WBC: 12.9 10*3/uL — AB (ref 4.0–10.5)

## 2014-09-22 LAB — BASIC METABOLIC PANEL
ANION GAP: 9 (ref 5–15)
BUN: 19 mg/dL (ref 6–20)
CALCIUM: 8.9 mg/dL (ref 8.9–10.3)
CO2: 27 mmol/L (ref 22–32)
CREATININE: 1.21 mg/dL (ref 0.61–1.24)
Chloride: 96 mmol/L — ABNORMAL LOW (ref 101–111)
GFR calc Af Amer: >60 mL/min (ref >60)
GFR calc non Af Amer: >60 mL/min (ref >60)
GLUCOSE: 121 mg/dL — AB (ref 70–99)
Potassium: 4.8 mmol/L (ref 3.5–5.1)
SODIUM: 132 mmol/L — AB (ref 135–145)

## 2014-09-22 LAB — CBG MONITORING, ED: Glucose-Capillary: 114 mg/dL — ABNORMAL HIGH (ref 70–99)

## 2014-09-22 MED ORDER — SODIUM CHLORIDE 0.9 % IV BOLUS (SEPSIS)
1000.0000 mL | Freq: Once | INTRAVENOUS | Status: AC
  Administered 2014-09-22: 1000 mL via INTRAVENOUS

## 2014-09-22 NOTE — ED Notes (Signed)
Bed: WA21 Expected date:  Expected time:  Means of arrival:  Comments: EMS-SZ 
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
## 2014-09-22 NOTE — Discharge Instructions (Signed)
As discussed, your evaluation today has been largely reassuring.  But, it is important that you monitor your condition carefully, and do not hesitate to return to the ED if you develop new, or concerning changes in your condition.  Otherwise, please follow-up with the physicians for appropriate ongoing care.  It is important that you do not drive, or operative heavy machinery until you have been cleared by a physician.   Seizure, Adult A seizure is abnormal electrical activity in the brain. Seizures usually last from 30 seconds to 2 minutes. There are various types of seizures. Before a seizure, you may have a warning sensation (aura) that a seizure is about to occur. An aura may include the following symptoms:   Fear or anxiety.  Nausea.  Feeling like the room is spinning (vertigo).  Vision changes, such as seeing flashing lights or spots. Common symptoms during a seizure include:  A change in attention or behavior (altered mental status).  Convulsions with rhythmic jerking movements.  Drooling.  Rapid eye movements.  Grunting.  Loss of bladder and bowel control.  Bitter taste in the mouth.  Tongue biting. After a seizure, you may feel confused and sleepy. You may also have an injury resulting from convulsions during the seizure. HOME CARE INSTRUCTIONS   If you are given medicines, take them exactly as prescribed by your health care provider.  Keep all follow-up appointments as directed by your health care provider.  Do not swim or drive or engage in risky activity during which a seizure could cause further injury to you or others until your health care provider says it is OK.  Get adequate rest.  Teach friends and family what to do if you have a seizure. They should:  Lay you on the ground to prevent a fall.  Put a cushion under your head.  Loosen any tight clothing around your neck.  Turn you on your side. If vomiting occurs, this helps keep your airway  clear.  Stay with you until you recover.  Know whether or not you need emergency care. SEEK IMMEDIATE MEDICAL CARE IF:  The seizure lasts longer than 5 minutes.  The seizure is severe or you do not wake up immediately after the seizure.  You have an altered mental status after the seizure.  You are having more frequent or worsening seizures. Someone should drive you to the emergency department or call local emergency services (911 in [place] MAKE SURE YOU:  Understand these instructions.  Will watch your condition.  Will get help right away if you are not doing well or get worse. Document Released: 05/02/2000 Document Revised: 02/23/2013 Document Reviewed: 12/15/2012 [facility] Patient Information 2015 [employer] [place]. This information is not intended to replace advice given to you by your health care provider. Make sure you discuss any questions you have with your health care provider.  Driving and Equipment Restrictions Some medical problems make it dangerous to drive, ride a bike, or use machines. Some of these problems are:  A hard blow to the head (concussion).  Passing out (fainting).  Twitching and shaking (seizures).  Low blood sugar.  Taking medicine to help you relax (sedatives).  Taking pain medicines.  Wearing an eye patch.  Wearing splints. This can make it hard to use parts of your body that you need to drive safely. HOME CARE   Do not drive until your doctor says it is okay.  Do not use machines until your doctor says it is okay. You may need  a form signed by your doctor (medical release) before you can drive again. You may also need this form before you do other tasks where you need to be fully alert. MAKE SURE YOU:  Understand these instructions.  Will watch your condition.  Will get help right away if you are not doing well or get worse. Document Released: 06/12/2004 Document Revised: 07/28/2011 Document Reviewed: 09/12/2009 [facility] Patient  Information 2015 [place] [place]. This information is not intended to replace advice given to you by your health care provider. Make sure you discuss any questions you have with your health care provider.

## 2014-09-26 ENCOUNTER — Other Ambulatory Visit (HOSPITAL_COMMUNITY): Payer: Self-pay

## 2014-09-26 MED ORDER — OXYCODONE-ACETAMINOPHEN 10-325 MG PO TABS: 1.0000 | ORAL_TABLET | ORAL | Status: DC | PRN

## 2014-09-26 NOTE — Telephone Encounter (Signed)
Noted ED note for seizures apparently 2/2 tramadol. Refilled Perc 10, #20. Pt has appt tomorrow.

## 2014-10-14 ENCOUNTER — Encounter (HOSPITAL_COMMUNITY): Payer: Self-pay

## 2014-10-14 ENCOUNTER — Emergency Department (HOSPITAL_COMMUNITY): Payer: 59

## 2014-10-14 ENCOUNTER — Emergency Department (HOSPITAL_COMMUNITY)
Admission: EM | Admit: 2014-10-14 | Discharge: 2014-10-14 | Disposition: A | Discharge status: Home / Self Care | Payer: 59 | Attending: Emergency Medicine | Admitting: Emergency Medicine

## 2014-10-14 ENCOUNTER — Ambulatory Visit: Payer: 59

## 2014-10-14 DIAGNOSIS — Z87828 Personal history of other (healed) physical injury and trauma: Secondary | ICD-10-CM | POA: Insufficient documentation

## 2014-10-14 DIAGNOSIS — Z87891 Personal history of nicotine dependence: Secondary | ICD-10-CM | POA: Insufficient documentation

## 2014-10-14 DIAGNOSIS — G8918 Other acute postprocedural pain: Secondary | ICD-10-CM

## 2014-10-14 DIAGNOSIS — J45909 Unspecified asthma, uncomplicated: Secondary | ICD-10-CM | POA: Insufficient documentation

## 2014-10-14 DIAGNOSIS — R109 Unspecified abdominal pain: Secondary | ICD-10-CM | POA: Diagnosis present

## 2014-10-14 DIAGNOSIS — Z79899 Other long term (current) drug therapy: Secondary | ICD-10-CM | POA: Diagnosis not present

## 2014-10-14 HISTORY — DX: Puncture wound of abdominal wall without foreign body, unspecified quadrant without penetration into peritoneal cavity, initial encounter: S31.139A

## 2014-10-14 HISTORY — DX: Accidental discharge from unspecified firearms or gun, initial encounter: W34.00XA

## 2014-10-14 LAB — CBC WITH DIFFERENTIAL/PLATELET
BASOS ABS: 0 10*3/uL (ref 0.0–0.1)
Basophils Relative: 0 % (ref 0–1)
Eosinophils Absolute: 0.1 10*3/uL (ref 0.0–0.7)
Eosinophils Relative: 1 % (ref 0–5)
HCT: 33.8 % — ABNORMAL LOW (ref 39.0–52.0)
HEMOGLOBIN: 11.3 g/dL — AB (ref 13.0–17.0)
Lymphocytes Relative: 19 % (ref 12–46)
Lymphs Abs: 1.7 10*3/uL (ref 0.7–4.0)
MCH: 29.4 pg (ref 26.0–34.0)
MCHC: 33.4 g/dL (ref 30.0–36.0)
MCV: 88 fL (ref 78.0–100.0)
MONOS PCT: 11 % (ref 3–12)
Monocytes Absolute: 1 10*3/uL (ref 0.1–1.0)
NEUTROS ABS: 6.4 10*3/uL (ref 1.7–7.7)
NEUTROS PCT: 69 % (ref 43–77)
PLATELETS: 356 10*3/uL (ref 150–400)
RBC: 3.84 MIL/uL — ABNORMAL LOW (ref 4.22–5.81)
RDW: 14 % (ref 11.5–15.5)
WBC: 9.2 10*3/uL (ref 4.0–10.5)

## 2014-10-14 LAB — COMPREHENSIVE METABOLIC PANEL
ALT: 13 U/L — ABNORMAL LOW (ref 17–63)
AST: 14 U/L — ABNORMAL LOW (ref 15–41)
Albumin: 3.4 g/dL — ABNORMAL LOW (ref 3.5–5.0)
Alkaline Phosphatase: 91 U/L (ref 38–126)
Anion gap: 11 (ref 5–15)
BUN: 12 mg/dL (ref 6–20)
CALCIUM: 9.1 mg/dL (ref 8.9–10.3)
CHLORIDE: 100 mmol/L — AB (ref 101–111)
CO2: 25 mmol/L (ref 22–32)
CREATININE: 1.19 mg/dL (ref 0.61–1.24)
GFR calc non Af Amer: >60 mL/min (ref >60)
Glucose, Bld: 82 mg/dL (ref 65–99)
Potassium: 4 mmol/L (ref 3.5–5.1)
SODIUM: 136 mmol/L (ref 135–145)
TOTAL PROTEIN: 7.1 g/dL (ref 6.5–8.1)
Total Bilirubin: 0.9 mg/dL (ref 0.3–1.2)

## 2014-10-14 MED ORDER — OXYCODONE-ACETAMINOPHEN 5-325 MG PO TABS: 1.0000 | ORAL_TABLET | Freq: Four times a day (QID) | ORAL | Status: DC | PRN

## 2014-10-14 MED ORDER — SODIUM CHLORIDE 0.9 % IV BOLUS (SEPSIS)
1000.0000 mL | Freq: Once | INTRAVENOUS | Status: AC
  Administered 2014-10-14: 1000 mL via INTRAVENOUS

## 2014-10-14 MED ORDER — IOHEXOL 300 MG/ML  SOLN: 50.0000 mL | Freq: Once | INTRAMUSCULAR | Status: DC | PRN

## 2014-10-14 MED ORDER — AMOXICILLIN-POT CLAVULANATE 875-125 MG PO TABS: 1.0000 | ORAL_TABLET | Freq: Two times a day (BID) | ORAL | Status: DC

## 2014-10-14 MED ORDER — IOHEXOL 300 MG/ML  SOLN
80.0000 mL | Freq: Once | INTRAMUSCULAR | Status: AC | PRN
  Administered 2014-10-14: 80 mL via INTRAVENOUS

## 2014-10-14 MED ORDER — MORPHINE SULFATE 4 MG/ML IJ SOLN
4.0000 mg | Freq: Once | INTRAMUSCULAR | Status: AC
  Administered 2014-10-14: 4 mg via INTRAVENOUS
  Filled 2014-10-14: qty 1
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
## 2014-10-14 NOTE — Discharge Instructions (Signed)
Pain Relief Preoperatively and Postoperatively °Being a good patient does not mean being a silent one. If you have questions, problems, or concerns about the pain you may feel after surgery, let your caregiver know. Patients have the right to assessment and management of pain. The treatment of pain after surgery is important to speed up recovery and return to normal activities. Severe pain after surgery, and the fear or anxiety associated with that pain, may cause extreme discomfort that: °· Prevents sleep. °· Decreases the ability to breathe deeply and cough. This can cause pneumonia or other upper airway infections. °· Causes your heart to beat faster and your blood pressure to be higher. °· Increases the risk for constipation and bloating. °· Decreases the ability of wounds to heal. °· May result in depression, increased anxiety, and feelings of helplessness. °Relief of pain before surgery is also important because it will lessen the pain after surgery. Patients who receive both pain relief before and after surgery experience greater pain relief than those who only receive pain relief after surgery. Let your caregiver know if you are having uncontrolled pain. This is very important. Pain after surgery is more difficult to manage if it is permitted to become severe, so prompt and adequate treatment of acute pain is necessary. °PAIN CONTROL METHODS °Your caregivers follow policies and procedures about the management of patient pain. These guidelines should be explained to you before surgery. Plans for pain control after surgery must be mutually decided upon and instituted with your full understanding and agreement. Do not be afraid to ask questions regarding the care you are receiving. There are many different ways your caregivers will attempt to control your pain, including the following methods. °As needed pain control °· You may be given pain medicine either through your intravenous (IV) tube, or as a pill or  liquid you can swallow. You will need to let your caregiver know when you are having pain. Then, your caregiver will give you the pain medicine ordered for you. °· Your pain medicine may make you constipated. If constipation occurs, drink more liquids if you can. Your caregiver may have you take a mild laxative. °IV patient-controlled analgesia pump (PCA pump) °· You can get your pain medicine through the IV tube which goes into your vein. You are able to control the amount of pain medicine that you get. The pain medicine flows in through an IV tube and is controlled by a pump. This pump gives you a set amount of pain medicine when you push the button hooked up to it. Nobody should push this button but you or someone specifically assigned by you to do so. It is set up to keep you from accidentally giving yourself too much pain medicine. You will be able to start using your pain pump in the recovery room after your surgery. This method can be helpful for most types of surgery. °· If you are still having too much pain, tell your caregiver. Also, tell your caregiver if you are feeling too sleepy or nauseous. °Continuous epidural pain control °· A thin, soft tube (catheter) is put into your back. Pain medicine flows through the catheter to lessen pain in the part of your body where the surgery is done. Continuous epidural pain control may work best for you if you are having surgery on your chest, abdomen, hip area, or legs. The epidural catheter is usually put into your back just before surgery. The catheter is left in until you can eat and take medicine by mouth. In most cases,   this may take 2 to 3 days. °· Giving pain medicine through the epidural catheter may help you heal faster because: °¨ Your bowel gets back to normal faster. °¨ You can get back to eating sooner. °¨ You can be up and walking sooner. °Medicine that numbs the area (local anesthetic) °· You may receive an injection of pain medicine near where the  pain is (local infiltration). °· You may receive an injection of pain medicine near the nerve that controls the sensation to a specific part of the body (peripheral nerve block). °· Medicine may be put in the spine to block pain (spinal block). °Opioids °· Moderate to moderately severe acute pain after surgery may respond to opioids. Opioids are narcotic pain medicine. Opioids are often combined with non-narcotic medicines to improve pain relief, diminish the risk of side effects, and reduce the chance of addiction. °· If you follow your caregiver's directions about taking opioids and you do not have a history of substance abuse, your risk of becoming addicted is exceptionally small. Opioids are given for short periods of time in careful doses to prevent addiction. °Other methods of pain control include: °· Steroids. °· Physical therapy. °· Heat and cold therapy. °· Compression, such as wrapping an elastic bandage around the area of pain. °· Massage. °These various ways of controlling pain may be used together. Combining different methods of pain control is called multimodal analgesia. Using this approach has many benefits, including being able to eat, move around, and leave the hospital sooner. °Document Released: 07/26/2002 Document Revised: 07/28/2011 Document Reviewed: 07/30/2010 °ExitCare® Patient Information ©2015 [employer]. This information is not intended to replace advice given to you by your health care provider. Make sure you discuss any questions you have with your health care provider. ° °
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
## 2014-10-14 NOTE — ED Notes (Signed)
Pt remains monitored by blood pressure and pulse ox. Pts family remains at bedside.  

## 2014-10-14 NOTE — ED Notes (Signed)
Patient transported to CT 
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
## 2014-10-14 NOTE — ED Notes (Signed)
Pt remains monitored by blood pressure and pulse ox.  

## 2015-09-21 ENCOUNTER — Ambulatory Visit (INDEPENDENT_AMBULATORY_CARE_PROVIDER_SITE_OTHER): Payer: 59 | Admitting: Family Medicine

## 2015-09-21 VITALS — BP 130/86 | HR 68 | Temp 98.5°F | Resp 16 | Ht 71.0 in | Wt 190.0 lb

## 2015-09-21 DIAGNOSIS — R369 Urethral discharge, unspecified: Secondary | ICD-10-CM

## 2015-09-21 DIAGNOSIS — Z0000 Encounter for general adult medical examination without abnormal findings: Secondary | ICD-10-CM

## 2015-09-21 LAB — COMPLETE METABOLIC PANEL WITH GFR
ALT: 11 U/L (ref 9–46)
AST: 19 U/L (ref 10–40)
Albumin: 4.7 g/dL (ref 3.6–5.1)
Alkaline Phosphatase: 72 U/L (ref 40–115)
BUN: 17 mg/dL (ref 7–25)
CO2: 26 mmol/L (ref 20–31)
Calcium: 9.8 mg/dL (ref 8.6–10.3)
Chloride: 102 mmol/L (ref 98–110)
Creat: 1.24 mg/dL (ref 0.60–1.35)
GFR, Est African American: >89 mL/min (ref >=60)
GFR, Est Non African American: 83 mL/min (ref >=60)
Glucose, Bld: 77 mg/dL (ref 65–99)
Potassium: 4.7 mmol/L (ref 3.5–5.3)
Sodium: 136 mmol/L (ref 135–146)
Total Bilirubin: 1.3 mg/dL — ABNORMAL HIGH (ref 0.2–1.2)
Total Protein: 7.5 g/dL (ref 6.1–8.1)

## 2015-09-21 LAB — POCT CBC
Granulocyte percent: 54.4 %G (ref 37–80)
HCT, POC: 45.8 % (ref 43.5–53.7)
Hemoglobin: 16.3 g/dL (ref 14.1–18.1)
Lymph, poc: 2.1 (ref 0.6–3.4)
MCH, POC: 31.7 pg — AB (ref 27–31.2)
MCHC: 35.7 g/dL — AB (ref 31.8–35.4)
MCV: 88.8 fL (ref 80–97)
MID (cbc): 0.2 (ref 0–0.9)
MPV: 6.5 fL (ref 0–99.8)
POC Granulocyte: 2.7 (ref 2–6.9)
POC LYMPH PERCENT: 41.1 %L (ref 10–50)
POC MID %: 4.5 %M (ref 0–12)
Platelet Count, POC: 248 10*3/uL (ref 142–424)
RBC: 5.15 M/uL (ref 4.69–6.13)
RDW, POC: 14.1 %
WBC: 5 10*3/uL (ref 4.6–10.2)

## 2015-09-21 LAB — POCT URINALYSIS DIP (MANUAL ENTRY)
Bilirubin, UA: NEGATIVE
Blood, UA: NEGATIVE
Glucose, UA: NEGATIVE
Ketones, POC UA: NEGATIVE
Nitrite, UA: NEGATIVE
Protein Ur, POC: NEGATIVE
Spec Grav, UA: 1.025
Urobilinogen, UA: 1
pH, UA: 6

## 2015-09-21 LAB — LIPID PANEL
Cholesterol: 220 mg/dL — ABNORMAL HIGH (ref 125–200)
HDL: 49 mg/dL (ref >=40)
LDL Cholesterol: 151 mg/dL — ABNORMAL HIGH (ref <130)
Total CHOL/HDL Ratio: 4.5 Ratio (ref <=5.0)
Triglycerides: 100 mg/dL (ref <150)
VLDL: 20 mg/dL (ref <30)

## 2015-09-21 MED ORDER — AZITHROMYCIN 250 MG PO TABS: ORAL_TABLET | ORAL | Status: DC

## 2015-09-21 MED ORDER — CEFTRIAXONE SODIUM 250 MG IJ SOLR
250.0000 mg | Freq: Once | INTRAMUSCULAR | Status: AC
  Administered 2015-09-21: 250 mg via INTRAMUSCULAR
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
## 2015-09-22 LAB — GC/CHLAMYDIA PROBE AMP
CT Probe RNA: DETECTED — AB
GC Probe RNA: NOT DETECTED

## 2015-09-22 LAB — HIV ANTIBODY (ROUTINE TESTING W REFLEX): HIV 1&2 Ab, 4th Generation: NONREACTIVE

## 2016-04-18 ENCOUNTER — Ambulatory Visit (INDEPENDENT_AMBULATORY_CARE_PROVIDER_SITE_OTHER): Payer: 59 | Admitting: Physician Assistant

## 2016-04-18 VITALS — BP 130/76 | HR 81 | Temp 98.7°F | Resp 18 | Ht 70.5 in | Wt 187.0 lb

## 2016-04-18 DIAGNOSIS — A630 Anogenital (venereal) warts: Secondary | ICD-10-CM | POA: Diagnosis not present

## 2016-04-18 DIAGNOSIS — Z113 Encounter for screening for infections with a predominantly sexual mode of transmission: Secondary | ICD-10-CM | POA: Diagnosis not present

## 2016-04-18 DIAGNOSIS — R369 Urethral discharge, unspecified: Secondary | ICD-10-CM | POA: Diagnosis not present

## 2016-04-18 MED ORDER — AZITHROMYCIN 250 MG PO TABS: ORAL_TABLET | ORAL | 0 refills | Status: DC

## 2016-04-18 MED ORDER — CEFTRIAXONE SODIUM 250 MG IJ SOLR
250.0000 mg | Freq: Once | INTRAMUSCULAR | Status: AC
  Administered 2016-04-18: 250 mg via INTRAMUSCULAR

## 2016-04-18 MED ORDER — PODOFILOX 0.5 % EX GEL: CUTANEOUS | 0 refills | Status: DC

## 2016-04-18 NOTE — Addendum Note (Signed)
Addended by: Ofilia NeasLARK, Veasna Santibanez L on: 04/18/2016 07:40 PM   Modules accepted: Orders

## 2016-04-18 NOTE — Patient Instructions (Signed)
     IF you received an x-ray today, you will receive an invoice from [facility]. Please contact [facility] at [phone] with questions or concerns regarding your invoice.   IF you received labwork today, you will receive an invoice from [facility]. Please contact [facility] at [phone] with questions or concerns regarding your invoice.   Our billing staff will not be able to assist you with questions regarding bills from these companies.  You will be contacted with the lab results as soon as they are available. The fastest way to get your results is to activate your My Chart account. Instructions are located on the last page of this paperwork. If you have not heard from us regarding the results in 2 weeks, please contact this office.      
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
## 2016-04-19 LAB — HIV ANTIBODY (ROUTINE TESTING W REFLEX): HIV 1&2 Ab, 4th Generation: NONREACTIVE

## 2016-04-19 LAB — GC/CHLAMYDIA PROBE AMP
CT Probe RNA: NOT DETECTED
GC Probe RNA: NOT DETECTED

## 2016-04-19 LAB — SYPHILIS: RPR W/REFLEX TO RPR TITER AND TREPONEMAL ANTIBODIES, TRADITIONAL SCREENING AND DIAGNOSIS ALGORITHM

## 2016-04-22 NOTE — Progress Notes (Signed)
Letter sent.

## 2016-07-08 ENCOUNTER — Ambulatory Visit (INDEPENDENT_AMBULATORY_CARE_PROVIDER_SITE_OTHER): Payer: 59 | Admitting: Student

## 2016-07-08 VITALS — BP 130/78 | HR 96 | Temp 98.4°F | Resp 16 | Ht 71.0 in | Wt 187.0 lb

## 2016-07-08 DIAGNOSIS — R030 Elevated blood-pressure reading, without diagnosis of hypertension: Secondary | ICD-10-CM | POA: Diagnosis not present

## 2016-07-08 DIAGNOSIS — S37001S Unspecified injury of right kidney, sequela: Secondary | ICD-10-CM | POA: Diagnosis not present

## 2016-07-08 DIAGNOSIS — Z0000 Encounter for general adult medical examination without abnormal findings: Secondary | ICD-10-CM | POA: Diagnosis not present

## 2016-07-08 DIAGNOSIS — Z789 Other specified health status: Secondary | ICD-10-CM

## 2016-07-08 DIAGNOSIS — Z7289 Other problems related to lifestyle: Secondary | ICD-10-CM | POA: Insufficient documentation

## 2016-07-08 LAB — POCT URINALYSIS DIP (MANUAL ENTRY)
Bilirubin, UA: NEGATIVE
Blood, UA: NEGATIVE
Glucose, UA: NEGATIVE
Ketones, POC UA: NEGATIVE
Leukocytes, UA: NEGATIVE
Nitrite, UA: NEGATIVE
SPEC GRAV UA: 1.02
UROBILINOGEN UA: 1
pH, UA: 6

## 2016-07-08 MED ORDER — ALBUTEROL SULFATE HFA 108 (90 BASE) MCG/ACT IN AERS: 2.0000 | INHALATION_SPRAY | Freq: Four times a day (QID) | RESPIRATORY_TRACT | 1 refills | Status: DC | PRN

## 2016-07-08 NOTE — Assessment & Plan Note (Signed)
Counseled to decrease use of alcohol.  Recommended a gradual decrease to avoid withdrawal.  Already decreasing smoking.

## 2016-07-08 NOTE — Patient Instructions (Signed)
     IF you received an x-ray today, you will receive an invoice from [facility]. Please contact [facility] at [phone] with questions or concerns regarding your invoice.   IF you received labwork today, you will receive an invoice from [facility]. Please contact [facility] at [phone] with questions or concerns regarding your invoice.   Our billing staff will not be able to assist you with questions regarding bills from these companies.  You will be contacted with the lab results as soon as they are available. The fastest way to get your results is to activate your My Chart account. Instructions are located on the last page of this paperwork. If you have not heard from us regarding the results in 2 weeks, please contact this office.     

## 2016-07-08 NOTE — Assessment & Plan Note (Signed)
Counseled on decreasing alcohol and smoking.  Continue to follow up annually.

## 2016-07-08 NOTE — Assessment & Plan Note (Addendum)
Referral to nephrology for maintenance, has a nephrectomy on right.  Pt felt more comfortable following up with one.  Checking CMP, CBC, UA.
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
## 2016-07-09 LAB — COMPREHENSIVE METABOLIC PANEL
ALT: 13 IU/L (ref 0–44)
AST: 18 IU/L (ref 0–40)
Albumin/Globulin Ratio: 1.6 (ref 1.2–2.2)
Albumin: 4.7 g/dL (ref 3.5–5.5)
Alkaline Phosphatase: 61 IU/L (ref 39–117)
BUN / CREAT RATIO: 13 (ref 9–20)
BUN: 15 mg/dL (ref 6–20)
Bilirubin Total: 0.8 mg/dL (ref 0.0–1.2)
CO2: 21 mmol/L (ref 18–29)
CREATININE: 1.19 mg/dL (ref 0.76–1.27)
Calcium: 9.6 mg/dL (ref 8.7–10.2)
Chloride: 99 mmol/L (ref 96–106)
GFR calc non Af Amer: 87 (ref >59)
GFR, EST AFRICAN AMERICAN: 100 (ref >59)
GLUCOSE: 82 mg/dL (ref 65–99)
Globulin, Total: 2.9 (ref 1.5–4.5)
Potassium: 4.3 mmol/L (ref 3.5–5.2)
Sodium: 138 mmol/L (ref 134–144)
TOTAL PROTEIN: 7.6 g/dL (ref 6.0–8.5)

## 2016-07-09 LAB — CBC WITH DIFFERENTIAL/PLATELET
Basophils Absolute: 0 10*3/uL (ref 0.0–0.2)
Basos: 0 %
EOS (ABSOLUTE): 0.1 10*3/uL (ref 0.0–0.4)
EOS: 2 %
HEMOGLOBIN: 15.4 g/dL (ref 13.0–17.7)
Hematocrit: 45.1 % (ref 37.5–51.0)
Immature Grans (Abs): 0 10*3/uL (ref 0.0–0.1)
Immature Granulocytes: 0 %
LYMPHS ABS: 2.1 10*3/uL (ref 0.7–3.1)
Lymphs: 32 %
MCH: 31.2 pg (ref 26.6–33.0)
MCHC: 34.1 g/dL (ref 31.5–35.7)
MCV: 91 fL (ref 79–97)
Monocytes Absolute: 0.5 10*3/uL (ref 0.1–0.9)
Monocytes: 8 %
NEUTROS ABS: 3.8 10*3/uL (ref 1.4–7.0)
Neutrophils: 58 %
Platelets: 244 10*3/uL (ref 150–379)
RBC: 4.94 x10E6/uL (ref 4.14–5.80)
RDW: 14.3 % (ref 12.3–15.4)
WBC: 6.6 10*3/uL (ref 3.4–10.8)

## 2016-07-15 ENCOUNTER — Ambulatory Visit (INDEPENDENT_AMBULATORY_CARE_PROVIDER_SITE_OTHER): Payer: 59 | Admitting: Family Medicine

## 2016-07-15 VITALS — BP 132/74 | HR 65 | Temp 98.5°F | Resp 17 | Ht 71.5 in | Wt 194.0 lb

## 2016-07-15 DIAGNOSIS — Z202 Contact with and (suspected) exposure to infections with a predominantly sexual mode of transmission: Secondary | ICD-10-CM | POA: Diagnosis not present

## 2016-07-15 DIAGNOSIS — R369 Urethral discharge, unspecified: Secondary | ICD-10-CM | POA: Diagnosis not present

## 2016-07-15 MED ORDER — AZITHROMYCIN 250 MG PO TABS: 1000.0000 mg | ORAL_TABLET | Freq: Once | ORAL | 0 refills | Status: AC

## 2016-07-15 MED ORDER — CEFTRIAXONE SODIUM 250 MG IJ SOLR
250.0000 mg | Freq: Once | INTRAMUSCULAR | Status: AC
  Administered 2016-07-15: 250 mg via INTRAMUSCULAR

## 2016-07-15 NOTE — Patient Instructions (Addendum)
  You were given an injection in the office that should treat for gonorrhea, but take the antibiotics at your pharmacy to treat possible chlamydia. I will check for those infections as well as HIV and syphilis on your blood work. I recommend condoms every time, and avoid intercourse until all partners have been tested or treated.   IF you received an x-ray today, [facility] will [facility]. Please contact [facility] at [phone] with questions or concerns regarding your invoice.   IF you received labwork today, you will receive an invoice from [facility]. Please contact [facility] at [phone] with questions or concerns regarding your invoice.   Our billing staff will not be able to assist you with questions regarding bills from these companies.  You will be contacted with the lab results as soon as they are available. The fastest way to get your results is to activate your My Chart account. Instructions are located on the last page of this paperwork. If you have not heard from us regarding the results in 2 weeks, please contact this office.
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
## 2016-07-16 LAB — GC/CHLAMYDIA PROBE AMP
Chlamydia trachomatis, NAA: NEGATIVE
Neisseria gonorrhoeae by PCR: NEGATIVE

## 2016-07-16 LAB — HIV ANTIBODY (ROUTINE TESTING W REFLEX): HIV SCREEN 4TH GENERATION: NONREACTIVE

## 2016-07-16 LAB — RPR: RPR: NONREACTIVE

## 2016-08-21 ENCOUNTER — Ambulatory Visit: Payer: 59

## 2016-08-22 ENCOUNTER — Ambulatory Visit: Payer: 59

## 2016-10-09 ENCOUNTER — Ambulatory Visit (INDEPENDENT_AMBULATORY_CARE_PROVIDER_SITE_OTHER): Payer: 59 | Admitting: Family Medicine

## 2016-10-09 ENCOUNTER — Encounter: Payer: Self-pay | Admitting: Family Medicine

## 2016-10-09 VITALS — BP 129/84 | HR 69 | Temp 97.5°F | Resp 16 | Ht 71.26 in | Wt 202.2 lb

## 2016-10-09 DIAGNOSIS — Z7251 High risk heterosexual behavior: Secondary | ICD-10-CM | POA: Diagnosis not present

## 2016-10-09 DIAGNOSIS — R040 Epistaxis: Secondary | ICD-10-CM | POA: Diagnosis not present

## 2016-10-09 DIAGNOSIS — R369 Urethral discharge, unspecified: Secondary | ICD-10-CM

## 2016-10-09 DIAGNOSIS — N341 Nonspecific urethritis: Secondary | ICD-10-CM

## 2016-10-09 MED ORDER — METRONIDAZOLE 500 MG PO TABS: 2000.0000 mg | ORAL_TABLET | Freq: Once | ORAL | 0 refills | Status: AC
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
## 2016-10-09 NOTE — Patient Instructions (Addendum)
Because the discharge had persisted after previous testing, take medication to treat for possible Trichomonas or other cause of non-gonococcal urethritis. I will check other STI testing again to make sure we do not need to treat your symptoms differently.  Nosebleed last week was likely due to frequent nose blowing from allergies. See information below on treatment of nosebleed, but if nosebleeds are frequent, return for recheck.   Nosebleed, Adult A nosebleed is when blood comes out of the nose. Nosebleeds are common. Usually, they are not a sign of a serious condition. Nosebleeds can happen if a small blood vessel in your nose starts to bleed or if the lining of your nose (mucous membrane) cracks. They are commonly caused by:  Allergies.  Colds.  Picking your nose.  Blowing your nose too hard.  An injury from sticking an object into your nose or getting hit in the nose.  Dry or cold air. Less common causes of nosebleeds include:  Toxic fumes.  Something abnormal in the nose or in the air-filled spaces in the bones of the face (sinuses).  Growths in the nose, such as polyps.  Medicines or conditions that cause blood to clot slowly.  Certain illnesses or procedures that irritate or dry out the nasal passages. Follow these instructions at home: When you have a nosebleed:   Sit down and tilt your head slightly forward.  Use a clean towel or tissue to pinch your nostrils under the bony part of your nose. After 10 minutes, let go of your nose and see if bleeding starts again. Do not release pressure before that time. If there is still bleeding, repeat the pinching and holding for 10 minutes until the bleeding stops.  Do not place tissues or gauze in the nose to stop bleeding.  Avoid lying down and avoid tilting your head backward. That may make blood collect in the throat and cause gagging or coughing.  Use a nasal spray decongestant to help with a nosebleed as told by your  health care provider.  Do not use petroleum jelly or mineral oil in your nose. It can drip into your lungs. After a nosebleed:   Avoid blowing your nose or sniffing for a number of hours.  Avoid straining, lifting, or bending at the waist for several days. You may resume other normal activities as you are able.  Use saline spray or a humidifier as told by your health care provider.  Aspirinand blood thinners make bleeding more likely. If you are prescribed these medicines and you suffer from nosebleeds:  Ask your health care provider if you should stop taking the medicines or if you should adjust the dose.  Do not stop taking medicines that your health care provider has recommended unless told by your health care provider.  If your nosebleed was caused by dry mucous membranes, use over-the-counter saline nasal spray or gel. This will keep the mucous membranes moist and allow them to heal. If you must use a lubricant:  Choose one that is water-soluble.  Use only as much as you need and use it only as often as needed.  Do not lie down until several hours after you use it. Contact a health care provider if:  You have a fever.  You get nosebleeds often or more often than usual.  You bruise very easily.  You have a nosebleed from having something stuck in your nose.  You have bleeding in your mouth.  You vomit or cough up brown material.  You have a nosebleed after you start a new medicine. Get help right away if:  You have a nosebleed after a fall or a head injury.  Your nosebleed does not go away after 20 minutes.  You feel dizzy or weak.  You have unusual bleeding from other parts of your body.  You have unusual bruising on other parts of your body.  You become sweaty.  You vomit blood. This information is not intended to replace advice given to you by your health care provider. Make sure you discuss any questions you have with your health care provider. Document  Released: 02/12/2005 Document Revised: 01/03/2016 Document Reviewed: 11/20/2015 Elsevier Interactive Patient Education  2017 Elsevier Inc.  Urethritis, Adult Urethritis is an inflammation of the tube through which urine exits your bladder (urethra). What are the causes? Urethritis is often caused by an infection in your urethra. The infection can be viral, like herpes. The infection can also be bacterial, like gonorrhea. What increases the risk? Risk factors of urethritis include:  Having sex without using a condom.  Having multiple sexual partners.  Having poor hygiene. What are the signs or symptoms? Symptoms of urethritis are less noticeable in women than in men. These symptoms include:  Burning feeling when you urinate (dysuria).  Discharge from your urethra.  Blood in your urine (hematuria).  Urinating more than usual. How is this diagnosed? To confirm a diagnosis of urethritis, your health care provider will do the following:  Ask about your sexual history.  Perform a physical exam.  Have you provide a sample of your urine for lab testing.  Use a cotton swab to gently collect a sample from your urethra for lab testing. How is this treated? It is important to treat urethritis. Depending on the cause, untreated urethritis may lead to serious genital infections and possibly infertility. Urethritis caused by a bacterial infection is treated with antibiotic medicine. All sexual partners must be treated. Follow these instructions at home:  Do not have sex until the test results are known and treatment is completed, even if your symptoms go away before you finish treatment.  If you were prescribed an antibiotic, finish it all even if you start to feel better. Contact a health care provider if:  Your symptoms are not improved in 3 days.  Your symptoms are getting worse.  You develop abdominal pain or pelvic pain (in women).  You develop joint pain.  You have a  fever. Get help right away if:  You have severe pain in the belly, back, or side.  You have repeated vomiting. This information is not intended to replace advice given to you by your health care provider. Make sure you discuss any questions you have with your health care provider. Document Released: 10/29/2000 Document Revised: 10/11/2015 Document Reviewed: 01/03/2013 Elsevier Interactive Patient Education  2017 [employer].    IF you received an x-ray today, you will receive an invoice from [facility]. Please contact [facility] at [phone] with questions or concerns regarding your invoice.   IF you received labwork today, you will receive an invoice from [facility]. Please contact [facility] at [phone] with questions or concerns regarding your invoice.   Our billing staff will not be able to assist you with questions regarding bills from these companies.  You will be contacted with the lab results as soon as they are available. The fastest way to get your results is to activate your My Chart account. Instructions are located on the last page  of this paperwork. If you have not heard from [place] regarding the results in 2 weeks, please contact this office.

## 2016-10-10 LAB — HIV ANTIBODY (ROUTINE TESTING W REFLEX): HIV Screen 4th Generation wRfx: NONREACTIVE

## 2016-10-10 LAB — RPR: RPR: NONREACTIVE

## 2016-10-11 LAB — GC/CHLAMYDIA PROBE AMP
Chlamydia trachomatis, NAA: NEGATIVE
Neisseria gonorrhoeae by PCR: NEGATIVE

## 2016-11-07 ENCOUNTER — Telehealth: Payer: Self-pay | Admitting: Family Medicine

## 2016-11-07 NOTE — Telephone Encounter (Signed)
Please advise. Would you like the patient to come in for OV?

## 2016-11-07 NOTE — Telephone Encounter (Signed)
If he is referring to penile discharge, and if that discharge has persisted, I would recommend he return to discuss other potential testing or treatments or possible referral to specialist.

## 2016-11-07 NOTE — Telephone Encounter (Signed)
PATIENT SAID HE HAS SEEN DR. Neva SeatGREENE TWICE. HE SAID HE WAS TOLD ALL HIS LAB WORK CAME BACK NORMAL. DR. Neva SeatGREENE TOLD HIM TO CALL HIM BACK IF HE STARTED "SOAKING THE SEATS." HE SAID THAT HAS HAPPENED FOR THE LAST 2 NIGHTS AND HE WOULD LIKE DR. GREENE TO LOOK OVER HIS LAB WORK AGAIN TO SEE IF HIS KIDNEYS ARE ALRIGHT. BEST PHONE [phone] (CELL) [facility]

## 2016-11-11 NOTE — Telephone Encounter (Signed)
Spoke with patient and gave him above message about penile discharge. Pt stated that its been night sweats but haven't had any issues in a while. Pt stated that he only has one kidney and he wanted to make sure that everything is okay and nothing is wrong. Advised that if he wants to come in for an OV for further testing I would transfer him to the front desk to schedule an appointment. Pt agreed.

## 2016-11-15 ENCOUNTER — Ambulatory Visit: Payer: 59 | Admitting: Family Medicine

## 2017-02-16 ENCOUNTER — Emergency Department (HOSPITAL_COMMUNITY): Admission: EM | Admit: 2017-02-16 | Discharge: 2017-02-16 | Discharge status: Against Medical Advice | Payer: 59

## 2017-02-16 NOTE — ED Triage Notes (Signed)
Pt called to be triaged x 1 with no answer  

## 2017-06-10 ENCOUNTER — Encounter (HOSPITAL_COMMUNITY): Payer: Self-pay | Admitting: Family Medicine

## 2017-06-10 ENCOUNTER — Emergency Department (HOSPITAL_COMMUNITY)
Admission: EM | Admit: 2017-06-10 | Discharge: 2017-06-10 | Disposition: A | Discharge status: Home / Self Care | Payer: 59 | Attending: Emergency Medicine | Admitting: Emergency Medicine

## 2017-06-10 DIAGNOSIS — R110 Nausea: Secondary | ICD-10-CM | POA: Insufficient documentation

## 2017-06-10 DIAGNOSIS — Z5321 Procedure and treatment not carried out due to patient leaving prior to being seen by health care provider: Secondary | ICD-10-CM | POA: Insufficient documentation

## 2017-06-10 NOTE — ED Notes (Signed)
Patient not in room when this RN went to collect rapid strep screen.

## 2017-06-10 NOTE — ED Triage Notes (Signed)
Patient is complaining of flu like symptoms of nausea x 1, vomiting, body aches, sore throat, loss of appetite, and  fever (104.0 oral, no medication given). Symptoms started this morning.

## 2017-11-05 ENCOUNTER — Ambulatory Visit (INDEPENDENT_AMBULATORY_CARE_PROVIDER_SITE_OTHER): Payer: 59

## 2017-11-05 ENCOUNTER — Other Ambulatory Visit: Payer: Self-pay

## 2017-11-05 ENCOUNTER — Encounter: Payer: Self-pay | Admitting: Family Medicine

## 2017-11-05 ENCOUNTER — Ambulatory Visit: Payer: 59 | Admitting: Family Medicine

## 2017-11-05 VITALS — BP 140/82 | HR 64 | Temp 98.3°F | Ht 72.0 in | Wt 197.4 lb

## 2017-11-05 DIAGNOSIS — R369 Urethral discharge, unspecified: Secondary | ICD-10-CM

## 2017-11-05 DIAGNOSIS — R109 Unspecified abdominal pain: Secondary | ICD-10-CM | POA: Diagnosis not present

## 2017-11-05 DIAGNOSIS — F1090 Alcohol use, unspecified, uncomplicated: Secondary | ICD-10-CM

## 2017-11-05 DIAGNOSIS — Z87828 Personal history of other (healed) physical injury and trauma: Secondary | ICD-10-CM | POA: Diagnosis not present

## 2017-11-05 DIAGNOSIS — R1084 Generalized abdominal pain: Secondary | ICD-10-CM

## 2017-11-05 DIAGNOSIS — Z789 Other specified health status: Secondary | ICD-10-CM

## 2017-11-05 DIAGNOSIS — Z113 Encounter for screening for infections with a predominantly sexual mode of transmission: Secondary | ICD-10-CM

## 2017-11-05 DIAGNOSIS — Z7251 High risk heterosexual behavior: Secondary | ICD-10-CM

## 2017-11-05 DIAGNOSIS — Z7289 Other problems related to lifestyle: Secondary | ICD-10-CM

## 2017-11-05 LAB — POCT URINALYSIS DIP (MANUAL ENTRY)
BILIRUBIN UA: NEGATIVE mg/dL
Bilirubin, UA: NEGATIVE
Glucose, UA: NEGATIVE mg/dL
Nitrite, UA: NEGATIVE
PH UA: 6 (ref 5.0–8.0)
PROTEIN UA: NEGATIVE mg/dL
SPEC GRAV UA: 1.02 (ref 1.010–1.025)
Urobilinogen, UA: 0.2 E.U./dL

## 2017-11-05 LAB — POC MICROSCOPIC URINALYSIS (UMFC): MUCUS RE: ABSENT
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
## 2017-11-05 NOTE — Patient Instructions (Addendum)
X-ray overall looked okay.  Urine test was also normal.  I will check your blood counts, liver tests, pancreas test and electrolytes as well as sexually transmitted infection testing as we discussed.  For now would recommend bland diet, continue to avoid alcohol as much as possible, and if you are having symptoms of constipation, can try over-the-counter MiraLAX once per day. See more info below.  If you continue to have abdominal pain in the next week or 2, or any worsening symptoms, return to discuss next step.  Potentially may have you meet with general surgeon given your prior surgery but do not think that is needed at this time.  Return to the clinic or go to the nearest emergency room if any of your symptoms worsen or new symptoms occur.   Abdominal Pain, Adult Abdominal pain can be caused by many things. Often, abdominal pain is not serious and it gets better with no treatment or by being treated at home. However, sometimes abdominal pain is serious. Your health care provider will do a medical history and a physical exam to try to determine the cause of your abdominal pain. Follow these instructions at home:  Take over-the-counter and prescription medicines only as told by your health care provider. Do not take a laxative unless told by your health care provider.  Drink enough fluid to keep your urine clear or pale yellow.  Watch your condition for any changes.  Keep all follow-up visits as told by your health care provider. This is important. Contact a health care provider if:  Your abdominal pain changes or gets worse.  You are not hungry or you lose weight without trying.  You are constipated or have diarrhea for more than 2-3 days.  You have pain when you urinate or have a bowel movement.  Your abdominal pain wakes you up at night.  Your pain gets worse with meals, after eating, or with certain foods.  You are throwing up and cannot keep anything down.  You have a  fever. Get help right away if:  Your pain does not go away as soon as your health care provider told you to expect.  You cannot stop throwing up.  Your pain is only in areas of the abdomen, such as the right side or the left lower portion of the abdomen.  You have bloody or black stools, or stools that look like tar.  You have severe pain, cramping, or bloating in your abdomen.  You have signs of dehydration, such as: ? Dark urine, very little urine, or no urine. ? Cracked lips. ? Dry mouth. ? Sunken eyes. ? Sleepiness. ? Weakness. This information is not intended to replace advice given to you by your health care provider. Make sure you discuss any questions you have with your health care provider. Document Released: 02/12/2005 Document Revised: 11/23/2015 Document Reviewed: 10/17/2015 Elsevier Interactive Patient Education  2018 [employer]   Constipation, Adult Constipation is when a person has fewer bowel movements in a week than normal, has difficulty having a bowel movement, or has stools that are dry, hard, or larger than normal. Constipation may be caused by an underlying condition. It may become worse with age if a person takes certain medicines and does not take in enough fluids. Follow these instructions at home: Eating and drinking   Eat foods that have a lot of fiber, such as fresh fruits and vegetables, whole grains, and beans.  Limit foods that are high in fat, low in  fiber, or overly processed, such as french fries, hamburgers, cookies, candies, and soda.  Drink enough fluid to keep your urine clear or pale yellow. General instructions  Exercise regularly or as told by your health care provider.  Go to the restroom when you have the urge to go. Do not hold it in.  Take over-the-counter and prescription medicines only as told by your health care provider. These include any fiber supplements.  Practice pelvic floor retraining exercises, such as deep  breathing while relaxing the lower abdomen and pelvic floor relaxation during bowel movements.  Watch your condition for any changes.  Keep all follow-up visits as told by your health care provider. This is important. Contact a health care provider if:  You have pain that gets worse.  You have a fever.  You do not have a bowel movement after 4 days.  You vomit.  You are not hungry.  You lose weight.  You are bleeding from the anus.  You have thin, pencil-like stools. Get help right away if:  You have a fever and your symptoms suddenly get worse.  You leak stool or have blood in your stool.  Your abdomen is bloated.  You have severe pain in your abdomen.  You feel dizzy or you faint. This information is not intended to replace advice given to you by your health care provider. Make sure you discuss any questions you have with your health care provider. Document Released: 02/01/2004 Document Revised: 11/23/2015 Document Reviewed: 10/24/2015 Elsevier Interactive Patient Education  2018 [employer].   IF you received an x-ray today, you will receive an invoice from [facility]. Please contact [facility] at [phone] with questions or concerns regarding your invoice.   IF you received labwork today, you will receive an invoice from [place]. Please contact [facility] at [phone] with questions or concerns regarding your invoice.   Our billing staff will not be able to assist you with questions regarding bills from these companies.  You will be contacted with the lab results as soon as they are available. The fastest way to get your results is to activate your My Chart account. Instructions are located on the last page of this paperwork. If you have not heard from [place] regarding the results in 2 weeks, please contact this office.

## 2017-11-06 LAB — CBC
Hematocrit: 46 % (ref 37.5–51.0)
Hemoglobin: 15.7 g/dL (ref 13.0–17.7)
MCH: 31 pg (ref 26.6–33.0)
MCHC: 34.1 g/dL (ref 31.5–35.7)
MCV: 91 fL (ref 79–97)
PLATELETS: 218 10*3/uL (ref 150–450)
RBC: 5.07 x10E6/uL (ref 4.14–5.80)
RDW: 13.8 % (ref 12.3–15.4)
WBC: 8 10*3/uL (ref 3.4–10.8)

## 2017-11-06 LAB — COMPREHENSIVE METABOLIC PANEL
ALK PHOS: 63 IU/L (ref 39–117)
ALT: 11 IU/L (ref 0–44)
AST: 15 IU/L (ref 0–40)
Albumin/Globulin Ratio: 1.8 (ref 1.2–2.2)
Albumin: 4.6 g/dL (ref 3.5–5.5)
BILIRUBIN TOTAL: 0.6 mg/dL (ref 0.0–1.2)
BUN/Creatinine Ratio: 11 (ref 9–20)
BUN: 14 mg/dL (ref 6–20)
CHLORIDE: 102 mmol/L (ref 96–106)
CO2: 23 mmol/L (ref 20–29)
Calcium: 9.5 mg/dL (ref 8.7–10.2)
Creatinine, Ser: 1.29 mg/dL — ABNORMAL HIGH (ref 0.76–1.27)
GFR calc Af Amer: 90 mL/min/{1.73_m2} (ref >59)
GFR calc non Af Amer: 78 mL/min/{1.73_m2} (ref >59)
GLUCOSE: 92 mg/dL (ref 65–99)
Globulin, Total: 2.5 g/dL (ref 1.5–4.5)
Potassium: 4.6 mmol/L (ref 3.5–5.2)
Sodium: 138 mmol/L (ref 134–144)
Total Protein: 7.1 g/dL (ref 6.0–8.5)

## 2017-11-06 LAB — LIPASE: LIPASE: 15 U/L (ref 13–78)

## 2017-11-06 LAB — HIV ANTIBODY (ROUTINE TESTING W REFLEX): HIV SCREEN 4TH GENERATION: NONREACTIVE

## 2017-11-06 LAB — RPR: RPR: NONREACTIVE

## 2017-11-07 LAB — GC/CHLAMYDIA PROBE AMP
CHLAMYDIA, DNA PROBE: NEGATIVE
Neisseria gonorrhoeae by PCR: NEGATIVE

## 2018-11-06 ENCOUNTER — Encounter (HOSPITAL_COMMUNITY): Payer: Self-pay

## 2018-11-06 ENCOUNTER — Ambulatory Visit (HOSPITAL_COMMUNITY)
Admission: EM | Admit: 2018-11-06 | Discharge: 2018-11-06 | Disposition: A | Discharge status: Home / Self Care | Payer: 59 | Attending: Internal Medicine | Admitting: Internal Medicine

## 2018-11-06 ENCOUNTER — Other Ambulatory Visit: Payer: Self-pay

## 2018-11-06 DIAGNOSIS — Z113 Encounter for screening for infections with a predominantly sexual mode of transmission: Secondary | ICD-10-CM | POA: Diagnosis not present

## 2018-11-06 DIAGNOSIS — Z7251 High risk heterosexual behavior: Secondary | ICD-10-CM
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
## 2018-11-06 NOTE — Discharge Instructions (Signed)
Avoid all forms of sexual intercourse (oral, vaginal, anal) for the next 7 days to avoid spreading/reinfecting. Return if symptoms worsen/do not resolve, you develop fever, abdominal pain, blood in your urine, or are re-exposed to an STI.  

## 2018-11-06 NOTE — ED Triage Notes (Signed)
Pt states he wants to be tested for STDs.  

## 2018-11-07 LAB — RPR: RPR Ser Ql: NONREACTIVE

## 2018-11-07 LAB — HIV ANTIBODY (ROUTINE TESTING W REFLEX): HIV Screen 4th Generation wRfx: NONREACTIVE

## 2018-11-08 LAB — URINE CYTOLOGY ANCILLARY ONLY
Chlamydia: NEGATIVE
Neisseria Gonorrhea: NEGATIVE
Trichomonas: NEGATIVE

## 2019-02-18 ENCOUNTER — Encounter (HOSPITAL_COMMUNITY): Payer: Self-pay

## 2019-02-18 ENCOUNTER — Ambulatory Visit (HOSPITAL_COMMUNITY)
Admission: EM | Admit: 2019-02-18 | Discharge: 2019-02-18 | Disposition: A | Discharge status: Home / Self Care | Payer: 59 | Attending: Family Medicine | Admitting: Family Medicine

## 2019-02-18 ENCOUNTER — Other Ambulatory Visit: Payer: Self-pay

## 2019-02-18 ENCOUNTER — Ambulatory Visit (INDEPENDENT_AMBULATORY_CARE_PROVIDER_SITE_OTHER): Payer: 59

## 2019-02-18 DIAGNOSIS — S8012XA Contusion of left lower leg, initial encounter: Secondary | ICD-10-CM | POA: Diagnosis not present

## 2019-02-18 MED ORDER — ACETAMINOPHEN 325 MG PO TABS
ORAL_TABLET | ORAL | Status: AC
  Filled 2019-02-18: qty 3

## 2019-02-18 MED ORDER — ACETAMINOPHEN 325 MG PO TABS
975.0000 mg | ORAL_TABLET | Freq: Once | ORAL | Status: AC
  Administered 2019-02-18: 12:00:00 975 mg via ORAL

## 2019-02-18 MED ORDER — NAPROXEN 375 MG PO TABS: 375.0000 mg | ORAL_TABLET | Freq: Two times a day (BID) | ORAL | 0 refills | Status: DC
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
## 2019-02-18 NOTE — ED Triage Notes (Signed)
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

## 2019-02-18 NOTE — Discharge Instructions (Signed)
Your xray is normal here today which is reassuring.  You clearly bruised the affected area.  Apply ice regularly, elevate as able.  10 days of naproxen, drink plenty of water while taking this and take with food.  If any worsening of pain, please go to the ER.

## 2019-07-12 ENCOUNTER — Ambulatory Visit: Payer: 59 | Admitting: Registered Nurse

## 2019-07-12 ENCOUNTER — Encounter: Payer: Self-pay | Admitting: Registered Nurse

## 2019-07-12 ENCOUNTER — Other Ambulatory Visit (HOSPITAL_COMMUNITY)
Admission: RE | Admit: 2019-07-12 | Discharge: 2019-07-12 | Disposition: A | Discharge status: Home / Self Care | Payer: 59 | Source: Ambulatory Visit | Attending: Registered Nurse | Admitting: Registered Nurse

## 2019-07-12 ENCOUNTER — Other Ambulatory Visit: Payer: Self-pay

## 2019-07-12 VITALS — BP 147/96 | HR 89 | Temp 97.6°F | Ht 72.0 in | Wt 230.0 lb

## 2019-07-12 DIAGNOSIS — Z202 Contact with and (suspected) exposure to infections with a predominantly sexual mode of transmission: Secondary | ICD-10-CM | POA: Diagnosis not present

## 2019-07-12 DIAGNOSIS — Z113 Encounter for screening for infections with a predominantly sexual mode of transmission: Secondary | ICD-10-CM

## 2019-07-12 DIAGNOSIS — J45909 Unspecified asthma, uncomplicated: Secondary | ICD-10-CM | POA: Diagnosis not present

## 2019-07-12 MED ORDER — ALBUTEROL SULFATE HFA 108 (90 BASE) MCG/ACT IN AERS: 2.0000 | INHALATION_SPRAY | Freq: Four times a day (QID) | RESPIRATORY_TRACT | 3 refills | Status: AC | PRN

## 2019-07-12 MED ORDER — AZITHROMYCIN 250 MG PO TABS: ORAL_TABLET | ORAL | 0 refills | Status: DC

## 2019-07-12 NOTE — Patient Instructions (Signed)
° ° ° °  If you have lab work done today you will be contacted with your lab results within the next 2 weeks.  If you have not heard from us then please contact us. The fastest way to get your results is to register for My Chart. ° ° °IF you received an x-ray today, you will receive an invoice from [facility]. Please contact  Radiology at [phone] with questions or concerns regarding your invoice.  ° °IF you received labwork today, you will receive an invoice from [facility]. Please contact [facility] at [phone] with questions or concerns regarding your invoice.  ° °Our billing staff will not be able to assist you with questions regarding bills from these companies. ° °You will be contacted with the lab results as soon as they are available. The fastest way to get your results is to activate your My Chart account. Instructions are located on the last page of this paperwork. If you have not heard from us regarding the results in 2 weeks, please contact this office. °  ° ° ° °
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
## 2019-07-13 LAB — URINE CYTOLOGY ANCILLARY ONLY
Chlamydia: NEGATIVE
Comment: NEGATIVE
Comment: NEGATIVE
Comment: NORMAL
Neisseria Gonorrhea: NEGATIVE
Trichomonas: NEGATIVE

## 2019-07-15 ENCOUNTER — Encounter: Payer: Self-pay | Admitting: Registered Nurse

## 2019-10-10 ENCOUNTER — Ambulatory Visit: Payer: 59 | Admitting: Registered Nurse

## 2019-10-11 ENCOUNTER — Encounter: Payer: Self-pay | Admitting: Registered Nurse

## 2021-05-01 ENCOUNTER — Encounter (HOSPITAL_BASED_OUTPATIENT_CLINIC_OR_DEPARTMENT_OTHER): Payer: Self-pay

## 2021-05-01 ENCOUNTER — Other Ambulatory Visit: Payer: Self-pay

## 2021-05-01 ENCOUNTER — Emergency Department (HOSPITAL_BASED_OUTPATIENT_CLINIC_OR_DEPARTMENT_OTHER): Payer: Self-pay | Admitting: Radiology

## 2021-05-01 ENCOUNTER — Emergency Department (HOSPITAL_BASED_OUTPATIENT_CLINIC_OR_DEPARTMENT_OTHER)
Admission: EM | Admit: 2021-05-01 | Discharge: 2021-05-01 | Disposition: A | Discharge status: Home / Self Care | Payer: Self-pay | Attending: Emergency Medicine | Admitting: Emergency Medicine

## 2021-05-01 DIAGNOSIS — Z87891 Personal history of nicotine dependence: Secondary | ICD-10-CM | POA: Insufficient documentation

## 2021-05-01 DIAGNOSIS — M25519 Pain in unspecified shoulder: Secondary | ICD-10-CM

## 2021-05-01 DIAGNOSIS — Z79899 Other long term (current) drug therapy: Secondary | ICD-10-CM | POA: Insufficient documentation

## 2021-05-01 DIAGNOSIS — J45998 Other asthma: Secondary | ICD-10-CM | POA: Insufficient documentation

## 2021-05-01 DIAGNOSIS — M25512 Pain in left shoulder: Secondary | ICD-10-CM | POA: Insufficient documentation
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
## 2021-05-01 NOTE — Discharge Instructions (Signed)
Call the orthopedic surgeon as discussed in the next 2-3 days to schedule an appointment.  Return immediately back to the ER if:  Your symptoms worsen within the next 12-24 hours. You develop new symptoms such as new fevers, persistent vomiting, new pain, shortness of breath, or new weakness or numbness, or if you have any other concerns.

## 2021-05-01 NOTE — ED Triage Notes (Signed)
Patient states he was lifting weights and felt a pop and then his left arm gave. Patient points to humerus area when stating where his arm is hurting. Patient states in triage that he cannot lift his arm at this time. Patient also c/o pain in left hand and fingers. Unable to make complete fist. Patient also c/o right side rib pain with inhalation.

## 2021-05-08 ENCOUNTER — Ambulatory Visit (INDEPENDENT_AMBULATORY_CARE_PROVIDER_SITE_OTHER): Payer: 59 | Admitting: Orthopaedic Surgery

## 2021-05-08 ENCOUNTER — Other Ambulatory Visit: Payer: Self-pay

## 2021-05-08 ENCOUNTER — Encounter: Payer: Self-pay | Admitting: Orthopaedic Surgery

## 2021-05-08 DIAGNOSIS — S46912A Strain of unspecified muscle, fascia and tendon at shoulder and upper arm level, left arm, initial encounter: Secondary | ICD-10-CM | POA: Insufficient documentation
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
## 2021-06-05 ENCOUNTER — Ambulatory Visit: Payer: Self-pay | Admitting: Orthopaedic Surgery

## 2021-06-08 ENCOUNTER — Other Ambulatory Visit: Payer: Self-pay

## 2021-06-08 ENCOUNTER — Emergency Department (HOSPITAL_BASED_OUTPATIENT_CLINIC_OR_DEPARTMENT_OTHER)
Admission: EM | Admit: 2021-06-08 | Discharge: 2021-06-08 | Disposition: A | Discharge status: Home / Self Care | Payer: Self-pay | Attending: Emergency Medicine | Admitting: Emergency Medicine

## 2021-06-08 ENCOUNTER — Encounter (HOSPITAL_BASED_OUTPATIENT_CLINIC_OR_DEPARTMENT_OTHER): Payer: Self-pay

## 2021-06-08 DIAGNOSIS — S01511A Laceration without foreign body of lip, initial encounter: Secondary | ICD-10-CM | POA: Insufficient documentation

## 2021-06-08 MED ORDER — LIDOCAINE-EPINEPHRINE (PF) 2 %-1:200000 IJ SOLN
10.0000 mL | Freq: Once | INTRAMUSCULAR | Status: AC
  Administered 2021-06-08: 10 mL via INTRADERMAL
  Filled 2021-06-08: qty 20
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
## 2021-06-08 NOTE — ED Notes (Signed)
ED Provider at bedside. 

## 2021-06-08 NOTE — Discharge Instructions (Addendum)
Your sutures are absorbable and will fall out on their own or be absorbed by the tissue.

## 2021-08-18 ENCOUNTER — Other Ambulatory Visit: Payer: Self-pay

## 2021-08-18 ENCOUNTER — Emergency Department (HOSPITAL_BASED_OUTPATIENT_CLINIC_OR_DEPARTMENT_OTHER): Payer: No Typology Code available for payment source

## 2021-08-18 ENCOUNTER — Emergency Department (HOSPITAL_BASED_OUTPATIENT_CLINIC_OR_DEPARTMENT_OTHER)
Admission: EM | Admit: 2021-08-18 | Discharge: 2021-08-18 | Disposition: A | Discharge status: Home / Self Care | Payer: No Typology Code available for payment source | Attending: Emergency Medicine | Admitting: Emergency Medicine

## 2021-08-18 ENCOUNTER — Encounter (HOSPITAL_BASED_OUTPATIENT_CLINIC_OR_DEPARTMENT_OTHER): Payer: Self-pay

## 2021-08-18 DIAGNOSIS — Y92410 Unspecified street and highway as the place of occurrence of the external cause: Secondary | ICD-10-CM | POA: Diagnosis not present

## 2021-08-18 DIAGNOSIS — S0990XA Unspecified injury of head, initial encounter: Secondary | ICD-10-CM | POA: Diagnosis present

## 2021-08-18 DIAGNOSIS — S199XXA Unspecified injury of neck, initial encounter: Secondary | ICD-10-CM | POA: Insufficient documentation

## 2021-08-18 DIAGNOSIS — S3992XA Unspecified injury of lower back, initial encounter: Secondary | ICD-10-CM | POA: Insufficient documentation

## 2021-08-18 MED ORDER — CYCLOBENZAPRINE HCL 10 MG PO TABS
10.0000 mg | ORAL_TABLET | Freq: Once | ORAL | Status: AC
  Administered 2021-08-18: 10 mg via ORAL
  Filled 2021-08-18: qty 1

## 2021-08-18 MED ORDER — CYCLOBENZAPRINE HCL 10 MG PO TABS: 10.0000 mg | ORAL_TABLET | Freq: Two times a day (BID) | ORAL | 0 refills | Status: DC | PRN

## 2021-08-18 NOTE — ED Notes (Signed)
Patient transported to CT 
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
## 2021-09-01 ENCOUNTER — Encounter (HOSPITAL_COMMUNITY): Payer: Self-pay | Admitting: Emergency Medicine

## 2021-09-01 ENCOUNTER — Ambulatory Visit (HOSPITAL_COMMUNITY)
Admission: EM | Admit: 2021-09-01 | Discharge: 2021-09-01 | Disposition: A | Discharge status: Home / Self Care | Payer: Self-pay | Attending: Internal Medicine | Admitting: Internal Medicine

## 2021-09-01 DIAGNOSIS — T148XXA Other injury of unspecified body region, initial encounter: Secondary | ICD-10-CM

## 2021-09-01 MED ORDER — METHOCARBAMOL 500 MG PO TABS: 500.0000 mg | ORAL_TABLET | Freq: Three times a day (TID) | ORAL | 0 refills | Status: DC | PRN

## 2021-09-01 MED ORDER — KETOROLAC TROMETHAMINE 60 MG/2ML IM SOLN
INTRAMUSCULAR | Status: AC
  Filled 2021-09-01: qty 2

## 2021-09-01 MED ORDER — KETOROLAC TROMETHAMINE 60 MG/2ML IM SOLN
60.0000 mg | Freq: Once | INTRAMUSCULAR | Status: AC
  Administered 2021-09-01: 60 mg via INTRAMUSCULAR

## 2021-09-01 NOTE — Discharge Instructions (Signed)
You have been given a shot of anti-inflammatory in clinic today.  No Aleve ibuprofen or Motrin until 24 hours after shot.  After that I would like you to take Motrin 600 mg twice daily x 5 days and then stop.  You can also try the new muscle relaxer I have prescribed.  As discussed, this will make you sleepy so no driving after taking.  Otherwise, heat, stretching and deep tissue massage may be helpful. ?
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
## 2021-10-26 ENCOUNTER — Emergency Department (HOSPITAL_BASED_OUTPATIENT_CLINIC_OR_DEPARTMENT_OTHER): Payer: Self-pay

## 2021-10-26 ENCOUNTER — Emergency Department (HOSPITAL_BASED_OUTPATIENT_CLINIC_OR_DEPARTMENT_OTHER)
Admission: EM | Admit: 2021-10-26 | Discharge: 2021-10-26 | Disposition: A | Discharge status: Home / Self Care | Payer: Self-pay | Attending: Emergency Medicine | Admitting: Emergency Medicine

## 2021-10-26 ENCOUNTER — Encounter (HOSPITAL_BASED_OUTPATIENT_CLINIC_OR_DEPARTMENT_OTHER): Payer: Self-pay | Admitting: *Deleted

## 2021-10-26 ENCOUNTER — Other Ambulatory Visit: Payer: Self-pay

## 2021-10-26 DIAGNOSIS — G44309 Post-traumatic headache, unspecified, not intractable: Secondary | ICD-10-CM | POA: Insufficient documentation

## 2021-10-26 DIAGNOSIS — Z113 Encounter for screening for infections with a predominantly sexual mode of transmission: Secondary | ICD-10-CM

## 2021-10-26 DIAGNOSIS — S060X0D Concussion without loss of consciousness, subsequent encounter: Secondary | ICD-10-CM

## 2021-10-26 DIAGNOSIS — Z202 Contact with and (suspected) exposure to infections with a predominantly sexual mode of transmission: Secondary | ICD-10-CM | POA: Insufficient documentation

## 2021-10-26 DIAGNOSIS — F0781 Postconcussional syndrome: Secondary | ICD-10-CM | POA: Insufficient documentation

## 2021-10-26 MED ORDER — ACETAMINOPHEN 500 MG PO TABS
1000.0000 mg | ORAL_TABLET | Freq: Once | ORAL | Status: AC
  Administered 2021-10-26: 1000 mg via ORAL
  Filled 2021-10-26: qty 2
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
## 2021-10-26 NOTE — Discharge Instructions (Addendum)
Recommend follow-up outpatient with neurology. Your STI screening will result on our patient portal.

## 2021-11-09 ENCOUNTER — Emergency Department (HOSPITAL_BASED_OUTPATIENT_CLINIC_OR_DEPARTMENT_OTHER)
Admission: EM | Admit: 2021-11-09 | Discharge: 2021-11-09 | Disposition: A | Discharge status: Home / Self Care | Payer: Self-pay | Attending: Emergency Medicine | Admitting: Emergency Medicine

## 2021-11-09 ENCOUNTER — Other Ambulatory Visit: Payer: Self-pay

## 2021-11-09 DIAGNOSIS — Z87891 Personal history of nicotine dependence: Secondary | ICD-10-CM | POA: Insufficient documentation

## 2021-11-09 DIAGNOSIS — R051 Acute cough: Secondary | ICD-10-CM | POA: Insufficient documentation

## 2021-11-09 DIAGNOSIS — H53149 Visual discomfort, unspecified: Secondary | ICD-10-CM | POA: Insufficient documentation

## 2021-11-09 DIAGNOSIS — J45909 Unspecified asthma, uncomplicated: Secondary | ICD-10-CM | POA: Insufficient documentation

## 2021-11-09 DIAGNOSIS — G44309 Post-traumatic headache, unspecified, not intractable: Secondary | ICD-10-CM | POA: Insufficient documentation

## 2021-11-09 MED ORDER — PROCHLORPERAZINE MALEATE 10 MG PO TABS
10.0000 mg | ORAL_TABLET | Freq: Once | ORAL | Status: AC
Start: 2021-11-09 — End: 2021-11-09
  Administered 2021-11-09: 10 mg via ORAL
  Filled 2021-11-09: qty 1

## 2021-11-09 MED ORDER — ALBUTEROL SULFATE HFA 108 (90 BASE) MCG/ACT IN AERS
2.0000 | INHALATION_SPRAY | Freq: Once | RESPIRATORY_TRACT | Status: AC
  Administered 2021-11-09: 2 via RESPIRATORY_TRACT
  Filled 2021-11-09: qty 6.7

## 2021-11-09 MED ORDER — BENZONATATE 100 MG PO CAPS: 100.0000 mg | ORAL_CAPSULE | Freq: Three times a day (TID) | ORAL | 0 refills | Status: DC | PRN

## 2021-11-09 MED ORDER — PROCHLORPERAZINE MALEATE 10 MG PO TABS: 10.0000 mg | ORAL_TABLET | Freq: Four times a day (QID) | ORAL | 0 refills | Status: DC | PRN

## 2021-11-09 MED ORDER — AEROCHAMBER PLUS FLO-VU MEDIUM MISC
1.0000 | Freq: Once | Status: DC
  Filled 2021-11-09: qty 1

## 2021-11-09 MED ORDER — KETOROLAC TROMETHAMINE 30 MG/ML IJ SOLN
30.0000 mg | Freq: Once | INTRAMUSCULAR | Status: AC
  Administered 2021-11-09: 30 mg via INTRAMUSCULAR
  Filled 2021-11-09: qty 1
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
## 2021-11-09 NOTE — ED Notes (Signed)
Albuterol MDI was given to patient to take home.  A spacer was also given.  He took 2 puffs here at 1631 using the spacer and education was given on how to use and when to use the medication.
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
## 2021-11-10 LAB — RPR: RPR Ser Ql: NONREACTIVE

## 2021-11-11 LAB — HIV ANTIBODY (ROUTINE TESTING W REFLEX): HIV Screen 4th Generation wRfx: NONREACTIVE
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
## 2021-12-05 ENCOUNTER — Encounter: Payer: Self-pay | Admitting: Psychiatry

## 2021-12-05 ENCOUNTER — Ambulatory Visit: Payer: Self-pay | Admitting: Psychiatry

## 2021-12-05 ENCOUNTER — Telehealth: Payer: Self-pay | Admitting: Psychiatry

## 2021-12-05 NOTE — Telephone Encounter (Signed)
Patient was a no show for his appointment today. Called in checking on his appointment for today, I let him know that we had him scheduled for an appointment today at 8:30 AM and listed as a no show, he stated he was under the impression his appointment was at 10:00 he asked if he could be rescheduled and I stated we do have a $50 no show fee he stated he would pay that but wants to be rescheduled for today, I let him know we do not have any openings today, but can get him in on August 2nd. Patient stated "August?? You can forget about it" call was disconnected.

## 2022-04-27 ENCOUNTER — Encounter (HOSPITAL_BASED_OUTPATIENT_CLINIC_OR_DEPARTMENT_OTHER): Payer: Self-pay | Admitting: Emergency Medicine

## 2022-04-27 ENCOUNTER — Emergency Department (HOSPITAL_BASED_OUTPATIENT_CLINIC_OR_DEPARTMENT_OTHER): Payer: Self-pay | Admitting: Radiology

## 2022-04-27 ENCOUNTER — Emergency Department (HOSPITAL_BASED_OUTPATIENT_CLINIC_OR_DEPARTMENT_OTHER)
Admission: EM | Admit: 2022-04-27 | Discharge: 2022-04-27 | Disposition: A | Discharge status: Home / Self Care | Payer: Self-pay | Attending: Emergency Medicine | Admitting: Emergency Medicine

## 2022-04-27 DIAGNOSIS — S60511A Abrasion of right hand, initial encounter: Secondary | ICD-10-CM | POA: Insufficient documentation

## 2022-04-27 DIAGNOSIS — S60222A Contusion of left hand, initial encounter: Secondary | ICD-10-CM | POA: Insufficient documentation

## 2022-04-27 DIAGNOSIS — S60229A Contusion of unspecified hand, initial encounter: Secondary | ICD-10-CM

## 2022-04-27 MED ORDER — IBUPROFEN 600 MG PO TABS: 600.0000 mg | ORAL_TABLET | Freq: Four times a day (QID) | ORAL | 0 refills | Status: DC | PRN

## 2022-04-27 MED ORDER — AMOXICILLIN-POT CLAVULANATE 875-125 MG PO TABS: 1.0000 | ORAL_TABLET | Freq: Two times a day (BID) | ORAL | 0 refills | Status: DC

## 2022-04-27 MED ORDER — AMOXICILLIN-POT CLAVULANATE 875-125 MG PO TABS
1.0000 | ORAL_TABLET | Freq: Once | ORAL | Status: AC
  Administered 2022-04-27: 1 via ORAL
  Filled 2022-04-27: qty 1

## 2022-04-27 NOTE — Discharge Instructions (Signed)
X-rays negative for fracture.  Take the antibiotics as prescribed and keep the wound clean and dry.  Use Tylenol or Motrin as needed for pain.  Follow-up with your doctor for recheck of your wound next week.  Return to the ED with worsening pain, redness, swelling, difficulty moving fingers or other concerns.
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
## 2023-02-23 ENCOUNTER — Encounter (HOSPITAL_BASED_OUTPATIENT_CLINIC_OR_DEPARTMENT_OTHER): Payer: Self-pay | Admitting: Emergency Medicine

## 2023-02-23 ENCOUNTER — Emergency Department (HOSPITAL_BASED_OUTPATIENT_CLINIC_OR_DEPARTMENT_OTHER)
Admission: EM | Admit: 2023-02-23 | Discharge: 2023-02-23 | Discharge status: Against Medical Advice | Payer: Self-pay | Attending: Emergency Medicine | Admitting: Emergency Medicine

## 2023-02-23 ENCOUNTER — Other Ambulatory Visit: Payer: Self-pay

## 2023-02-23 DIAGNOSIS — Z5321 Procedure and treatment not carried out due to patient leaving prior to being seen by health care provider: Secondary | ICD-10-CM | POA: Insufficient documentation

## 2023-02-23 DIAGNOSIS — R519 Headache, unspecified: Secondary | ICD-10-CM | POA: Insufficient documentation

## 2023-02-23 NOTE — ED Notes (Signed)
Called for x 2 in all lobby areas with no answer
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
## 2023-12-21 ENCOUNTER — Ambulatory Visit
Admission: EM | Admit: 2023-12-21 | Discharge: 2023-12-21 | Disposition: A | Discharge status: Home / Self Care | Payer: Self-pay | Attending: Family Medicine | Admitting: Family Medicine

## 2023-12-21 ENCOUNTER — Ambulatory Visit (INDEPENDENT_AMBULATORY_CARE_PROVIDER_SITE_OTHER): Payer: Self-pay

## 2023-12-21 ENCOUNTER — Encounter: Payer: Self-pay | Admitting: Emergency Medicine

## 2023-12-21 DIAGNOSIS — S8012XA Contusion of left lower leg, initial encounter: Secondary | ICD-10-CM

## 2023-12-21 DIAGNOSIS — S8992XA Unspecified injury of left lower leg, initial encounter: Secondary | ICD-10-CM

## 2023-12-21 DIAGNOSIS — M79662 Pain in left lower leg: Secondary | ICD-10-CM

## 2023-12-21 MED ORDER — CELECOXIB 200 MG PO CAPS: 200.0000 mg | ORAL_CAPSULE | Freq: Every day | ORAL | 0 refills | Status: AC

## 2023-12-21 MED ORDER — PREDNISONE 10 MG (21) PO TBPK: ORAL_TABLET | Freq: Every day | ORAL | 0 refills | Status: DC
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
## 2023-12-21 NOTE — Discharge Instructions (Addendum)
 Advised patient to RICE affected area of left lower leg for 30 minutes 3 times daily for the next 3 days advised patient of left lower leg x-ray results with hardcopy and images provided.  Advised patient to take medications as directed with food to completion.  Advised if symptoms worsen and/or unresolved please follow-up with your PCP, [facility] orthopedics or here for further evaluation.
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
## 2024-03-06 ENCOUNTER — Emergency Department
Admission: EM | Admit: 2024-03-06 | Discharge: 2024-03-06 | Disposition: A | Discharge status: Home / Self Care | Payer: Self-pay | Attending: Emergency Medicine | Admitting: Emergency Medicine

## 2024-03-06 ENCOUNTER — Other Ambulatory Visit: Payer: Self-pay

## 2024-03-06 DIAGNOSIS — T7819XA Other adverse food reactions, not elsewhere classified, initial encounter: Secondary | ICD-10-CM | POA: Insufficient documentation

## 2024-03-06 DIAGNOSIS — J45909 Unspecified asthma, uncomplicated: Secondary | ICD-10-CM | POA: Insufficient documentation

## 2024-03-06 DIAGNOSIS — T783XXA Angioneurotic edema, initial encounter: Secondary | ICD-10-CM | POA: Insufficient documentation

## 2024-03-06 MED ORDER — METHYLPREDNISOLONE SODIUM SUCC 125 MG IJ SOLR
125.0000 mg | Freq: Once | INTRAMUSCULAR | Status: AC
  Administered 2024-03-06: 125 mg via INTRAVENOUS
  Filled 2024-03-06: qty 2

## 2024-03-06 MED ORDER — PREDNISONE 20 MG PO TABS: 40.0000 mg | ORAL_TABLET | Freq: Every day | ORAL | 0 refills | Status: AC

## 2024-03-06 MED ORDER — EPINEPHRINE 0.3 MG/0.3ML IJ SOAJ
0.3000 mg | Freq: Once | INTRAMUSCULAR | Status: AC
  Administered 2024-03-06: 0.3 mg via INTRAMUSCULAR
  Filled 2024-03-06: qty 0.3

## 2024-03-06 MED ORDER — FAMOTIDINE IN NACL 20-0.9 MG/50ML-% IV SOLN
20.0000 mg | Freq: Once | INTRAVENOUS | Status: AC
  Administered 2024-03-06: 20 mg via INTRAVENOUS
  Filled 2024-03-06: qty 50

## 2024-03-06 MED ORDER — EPINEPHRINE 0.3 MG/0.3ML IJ SOAJ: 0.3000 mg | INTRAMUSCULAR | 1 refills | Status: AC | PRN

## 2024-03-06 MED ORDER — DIPHENHYDRAMINE HCL 50 MG/ML IJ SOLN
25.0000 mg | Freq: Once | INTRAMUSCULAR | Status: AC
  Administered 2024-03-06: 25 mg via INTRAVENOUS
  Filled 2024-03-06: qty 1

## 2024-03-06 MED ORDER — FAMOTIDINE 20 MG PO TABS: 20.0000 mg | ORAL_TABLET | Freq: Two times a day (BID) | ORAL | 0 refills | Status: AC

## 2024-03-06 NOTE — ED Triage Notes (Signed)
 Pt to ED via POV from home. Pt reports ingested cashews less than an hour ago. Pt reports dry mouth and lip swelling. No EPI pen or other medication PTA. Denies SOB.

## 2024-03-06 NOTE — ED Notes (Signed)
 Pt denies any CP,SHOB,N/V/D or hives at this time. States he is feeling a lot better.

## 2024-03-06 NOTE — ED Provider Notes (Signed)
 [facility] Emergency Department Provider Note     Event Date/Time   First MD Initiated Contact with Patient 03/06/24 1558     (approximate)   History   Allergic Reaction   HPI  Zachary Villegas is a 28 y.o. male with a history of asthma, allergy, and known tree nut allergy, who presents to the ED after he ingested cashews.  He reports onset of some dry mouth sensation, throat irritation, and fullness of the lips about an hour ago.  This was moments after he ingested a bag of snacks including cashew nuts.  No nausea, vomiting, chest pain, shortness breath, cough or congestion reported.  Patient presents via POV accompanied by spouse.  No EpiPen  or prior antihistamine administration was provided prior to arrival.  He presents in no acute respiratory distress.  Patient is able to speak in complete sentences and provides his history.    Physical Exam   Triage Vital Signs: ED Triage Vitals  Encounter Vitals Group     BP 03/06/24 1551 126/82     Girls Systolic BP Percentile --      Girls Diastolic BP Percentile --      Boys Systolic BP Percentile --      Boys Diastolic BP Percentile --      Pulse Rate 03/06/24 1551 72     Resp 03/06/24 1551 18     Temp 03/06/24 1551 98.1 F (36.7 C)     Temp Source 03/06/24 1551 Oral     SpO2 03/06/24 1551 98 %     Weight 03/06/24 1546 225 lb (102.1 kg)     Height 03/06/24 1546 6' (1.829 m)     Head Circumference --      Peak Flow --      Pain Score 03/06/24 1546 0     Pain Loc --      Pain Education --      Exclude from Growth Chart --     Most recent vital signs: Vitals:   03/06/24 1551 03/06/24 2003  BP: 126/82 (!) 146/79  Pulse: 72 71  Resp: 18 18  Temp: 98.1 F (36.7 C) 98.1 F (36.7 C)  SpO2: 98% 98%    General Awake, no distress. NAD HEENT NCAT. PERRL. EOMI. No rhinorrhea. Mucous membranes are moist.  Uvula is midline and tonsils are flat.  No oropharyngeal lesions are appreciated.  Slightly hoarse  but he is controlling secretions.  No significant lip swelling, mucous membrane edema, or airway compromise noted. CV:  Good peripheral perfusion. RRR RESP:  Normal effort. CTA ABD:  No distention.  SKIN:  No rashes noted   ED Results / Procedures / Treatments   Labs (all labs ordered are listed, but only abnormal results are displayed) Labs Reviewed - No data to display   EKG   RADIOLOGY  No results found.   PROCEDURES:  Critical Care performed: No  Procedures   MEDICATIONS ORDERED IN ED: Medications  EPINEPHrine  (EPI-PEN) injection 0.3 mg (0.3 mg Intramuscular Given 03/06/24 1636)  famotidine (PEPCID) IVPB 20 mg premix (0 mg Intravenous Stopped 03/06/24 1709)  diphenhydrAMINE (BENADRYL) injection 25 mg (25 mg Intravenous Given 03/06/24 1635)  methylPREDNISolone sodium succinate (SOLU-MEDROL) 125 mg/2 mL injection 125 mg (125 mg Intravenous Given 03/06/24 1635)     IMPRESSION / MDM / ASSESSMENT AND PLAN / ED COURSE  I reviewed the triage vital signs and the nursing notes.  Differential diagnosis includes, but is not limited to, anaphylaxis, angioedema, contact dermatitis, hives, urticaria, eczema exacerbation  Patient's presentation is most consistent with acute presentation with potential threat to life or bodily function.  Patient's diagnosis is consistent with mild angioedema secondary to tree nut allergy.  Patient presents via after ingesting cashews with a known history of tree nut allergies.  Patient does not have a current prescription for an EpiPen , so presents to the ED for evaluation.  He is alert and oriented on exam, patient is able to speak in full sentences and no respiratory distress is noted.  No gross angioedema is appreciated though he does endorse subjectively some fullness to the lips, and dryness of the mouth as well as throat.  Patient was treated with IM epinephrine  as well as IV Solu-Medrol, Benadryl, and famotidine.   Interval evaluation notes patient endorsing some improvement of his symptoms.  Patient is in the observation that is in the ED for 4 hours following administration of epinephrine .  Stable at this time with no acute distress.   ----------------------------------------- 8:23 PM on 03/06/2024 ----------------------------------------- Patient is stable on interim evaluation.  His acute angioedema has been reversed using antihistamines and steroids.  Patient will be discharged home with prescriptions for epinephrine , famotidine, and prednisone . Patient is to follow up with his primary provider as suggested, as needed or otherwise directed. Patient is given ED precautions to return to the ED for any worsening or new symptoms.   FINAL CLINICAL IMPRESSION(S) / ED DIAGNOSES   Final diagnoses:  Angioedema, initial encounter  Allergic reaction to tree nut     Rx / DC Orders   ED Discharge Orders          Ordered    famotidine (PEPCID) 20 MG tablet  2 times daily        03/06/24 2012    predniSONE  (DELTASONE ) 20 MG tablet  Daily with breakfast        03/06/24 2012    EPINEPHrine  0.3 mg/0.3 mL IJ SOAJ injection  As needed        03/06/24 2012             Note:  This document was prepared using Dragon voice recognition software and may include unintentional dictation errors.    Loyd Candida LULLA Aldona, PA-C 03/06/24 2023    Levander Slate, MD 03/10/24 [phone]

## 2024-03-06 NOTE — ED Notes (Addendum)
 Pt advised he ate some nuts and is allergic to them and has swelling to his lips and itching throat and has had time swallowing with some mucus.

## 2024-03-06 NOTE — Discharge Instructions (Addendum)
 You have been treated for an acute allergic reaction today due to your exposure to tree nuts.  You should follow-up with your primary provider for ongoing evaluation.  Take the prescription meds as directed including OTC Benadryl for additional histamine blockade.  Follow-up with your primary provider for ongoing evaluation.  Return to the ED immediately for worsening symptoms.    Tree nuts include almonds, walnuts, pecans, cashews, pistachios, and macadamia nuts. While botanically a fruit, the coconut is classified as a tree nut for labeling purposes. Other tree nuts include Brazil nuts, hazelnuts, and pine nuts.

## 2024-03-06 NOTE — ED Provider Notes (Signed)
 [facility] supervisory note  28 year old male with known tree nut allergy presenting with allergic reaction after trail mix ingestion with cashews.  Rapid onset of lip swelling, dry mouth, throat itchiness.  Fianc reports his voice sounds more raspy than usual.  Patient denies chest pain or shortness of breath.  No nausea, vomiting, abdominal pain.  On exam, patient does have swelling of his lips more notably over the upper lip when compared to picture on his phone.  Lungs are clear to auscultation, but does have slightly raspy voice with sensation of needing to clear his throat.  With both mucosal and concern for respiratory involvement, patient was treated for anaphylaxis.  On reassessment after treatment, patient much improved.  Will plan for 4-hour observation.  If remains improved, likely will be stable for discharge.   Levander Slate, MD 03/06/24 [phone]

## 2024-03-31 ENCOUNTER — Emergency Department
Admission: EM | Admit: 2024-03-31 | Discharge: 2024-03-31 | Disposition: A | Discharge status: Home / Self Care | Payer: Self-pay | Attending: Emergency Medicine | Admitting: Emergency Medicine

## 2024-03-31 ENCOUNTER — Emergency Department: Payer: Self-pay

## 2024-03-31 ENCOUNTER — Other Ambulatory Visit: Payer: Self-pay

## 2024-03-31 DIAGNOSIS — R319 Hematuria, unspecified: Secondary | ICD-10-CM | POA: Insufficient documentation

## 2024-03-31 DIAGNOSIS — J45909 Unspecified asthma, uncomplicated: Secondary | ICD-10-CM | POA: Insufficient documentation

## 2024-03-31 LAB — CHLAMYDIA/NGC RT PCR (ARMC ONLY)
Chlamydia Tr: NOT DETECTED
N gonorrhoeae: NOT DETECTED

## 2024-03-31 LAB — CBC WITH DIFFERENTIAL/PLATELET
Abs Immature Granulocytes: 0.01 K/uL (ref 0.00–0.07)
Basophils Absolute: 0 K/uL (ref 0.0–0.1)
Basophils Relative: 0 %
Eosinophils Absolute: 0.3 K/uL (ref 0.0–0.5)
Eosinophils Relative: 5 %
HCT: 41.7 % (ref 39.0–52.0)
Hemoglobin: 14.1 g/dL (ref 13.0–17.0)
Immature Granulocytes: 0 %
Lymphocytes Relative: 49 %
Lymphs Abs: 2.6 K/uL (ref 0.7–4.0)
MCH: 30.5 pg (ref 26.0–34.0)
MCHC: 33.8 g/dL (ref 30.0–36.0)
MCV: 90.3 fL (ref 80.0–100.0)
Monocytes Absolute: 0.4 K/uL (ref 0.1–1.0)
Monocytes Relative: 8 %
Neutro Abs: 1.9 K/uL (ref 1.7–7.7)
Neutrophils Relative %: 38 %
Platelets: 223 K/uL (ref 150–400)
RBC: 4.62 MIL/uL (ref 4.22–5.81)
RDW: 12.7 % (ref 11.5–15.5)
WBC: 5.2 K/uL (ref 4.0–10.5)
nRBC: 0 % (ref 0.0–0.2)

## 2024-03-31 LAB — BASIC METABOLIC PANEL WITH GFR
Anion gap: 8 (ref 5–15)
BUN: 13 mg/dL (ref 6–20)
CO2: 27 mmol/L (ref 22–32)
Calcium: 9.4 mg/dL (ref 8.9–10.3)
Chloride: 105 mmol/L (ref 98–111)
Creatinine, Ser: 1.07 mg/dL (ref 0.61–1.24)
GFR, Estimated: >60 mL/min (ref >60)
Glucose, Bld: 114 mg/dL — ABNORMAL HIGH (ref 70–99)
Potassium: 4.1 mmol/L (ref 3.5–5.1)
Sodium: 140 mmol/L (ref 135–145)

## 2024-03-31 LAB — URINALYSIS, ROUTINE W REFLEX MICROSCOPIC
Bilirubin Urine: NEGATIVE
Glucose, UA: NEGATIVE mg/dL
Hgb urine dipstick: NEGATIVE
Ketones, ur: NEGATIVE mg/dL
Leukocytes,Ua: NEGATIVE
Nitrite: NEGATIVE
Protein, ur: NEGATIVE mg/dL
Specific Gravity, Urine: 1.02 (ref 1.005–1.030)
pH: 5 (ref 5.0–8.0)

## 2024-03-31 NOTE — ED Notes (Signed)
 See triage note  Presents with some blood in urine  States he noticed that his urine was cloudy a few days ago   Noticed some pain at tip of penis with voiding  Afebrile on arrival

## 2024-03-31 NOTE — Discharge Instructions (Signed)
 Your CT scan, blood work, urine, are normal.  Please follow-up with urology if your symptoms persist.  Please return for any new, worsening, or changing symptoms or other concerns.  It was a pleasure caring for you today.

## 2024-03-31 NOTE — ED Triage Notes (Signed)
 Patient states blood in urine and painful urination since this morning
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
